# Patient Record
Sex: Female | Born: 1960
Health system: Southern US, Community
[De-identification: ages and names within clinical notes are randomized; demographics above are authoritative.]

## PROBLEM LIST (undated history)

## (undated) DIAGNOSIS — I10 Essential (primary) hypertension: Secondary | ICD-10-CM

## (undated) DIAGNOSIS — D329 Benign neoplasm of meninges, unspecified: Secondary | ICD-10-CM

## (undated) DIAGNOSIS — I341 Nonrheumatic mitral (valve) prolapse: Secondary | ICD-10-CM

## (undated) DIAGNOSIS — G43909 Migraine, unspecified, not intractable, without status migrainosus: Secondary | ICD-10-CM

## (undated) HISTORY — PX: OTHER SURGICAL HISTORY: SHX169

## (undated) HISTORY — DX: Essential (primary) hypertension: I10

## (undated) HISTORY — DX: Migraine, unspecified, not intractable, without status migrainosus: G43.909

## (undated) HISTORY — DX: Benign neoplasm of meninges, unspecified: D32.9

---

## 2004-01-09 ENCOUNTER — Other Ambulatory Visit: Admission: RE | Admit: 2004-01-09 | Discharge: 2004-01-09 | Payer: Self-pay | Admitting: Obstetrics and Gynecology

## 2004-01-23 ENCOUNTER — Ambulatory Visit (HOSPITAL_COMMUNITY): Admission: RE | Admit: 2004-01-23 | Discharge: 2004-01-23 | Payer: Self-pay | Admitting: Family Medicine

## 2004-02-05 ENCOUNTER — Encounter: Admission: RE | Admit: 2004-02-05 | Discharge: 2004-02-05 | Payer: Self-pay | Admitting: Family Medicine

## 2004-02-22 ENCOUNTER — Ambulatory Visit (HOSPITAL_COMMUNITY): Admission: RE | Admit: 2004-02-22 | Discharge: 2004-02-22 | Payer: Self-pay | Admitting: Obstetrics and Gynecology

## 2004-03-25 ENCOUNTER — Inpatient Hospital Stay (HOSPITAL_COMMUNITY): Admission: AD | Admit: 2004-03-25 | Discharge: 2004-03-25 | Payer: Self-pay | Admitting: *Deleted

## 2004-04-20 ENCOUNTER — Inpatient Hospital Stay (HOSPITAL_COMMUNITY): Admission: RE | Admit: 2004-04-20 | Discharge: 2004-04-20 | Payer: Self-pay | Admitting: Obstetrics and Gynecology

## 2004-05-16 ENCOUNTER — Inpatient Hospital Stay (HOSPITAL_COMMUNITY): Admission: AD | Admit: 2004-05-16 | Discharge: 2004-05-16 | Payer: Self-pay | Admitting: Obstetrics and Gynecology

## 2004-05-18 ENCOUNTER — Inpatient Hospital Stay (HOSPITAL_COMMUNITY): Admission: AD | Admit: 2004-05-18 | Discharge: 2004-05-18 | Payer: Self-pay | Admitting: Obstetrics and Gynecology

## 2005-06-13 ENCOUNTER — Emergency Department (HOSPITAL_COMMUNITY): Admission: EM | Admit: 2005-06-13 | Discharge: 2005-06-13 | Payer: Self-pay | Admitting: Emergency Medicine

## 2006-03-26 ENCOUNTER — Other Ambulatory Visit: Admission: RE | Admit: 2006-03-26 | Discharge: 2006-03-26 | Payer: Self-pay | Admitting: Family Medicine

## 2007-10-07 ENCOUNTER — Other Ambulatory Visit: Admission: RE | Admit: 2007-10-07 | Discharge: 2007-10-07 | Payer: Self-pay | Admitting: Family Medicine

## 2007-11-16 ENCOUNTER — Ambulatory Visit (HOSPITAL_COMMUNITY): Admission: RE | Admit: 2007-11-16 | Discharge: 2007-11-16 | Payer: Self-pay | Admitting: Family Medicine

## 2008-02-14 ENCOUNTER — Ambulatory Visit (HOSPITAL_BASED_OUTPATIENT_CLINIC_OR_DEPARTMENT_OTHER): Admission: RE | Admit: 2008-02-14 | Discharge: 2008-02-14 | Payer: Self-pay | Admitting: Orthopaedic Surgery

## 2010-09-21 ENCOUNTER — Encounter: Payer: Self-pay | Admitting: Family Medicine

## 2011-01-13 NOTE — Op Note (Signed)
NAME:  Joy Walsh, Joy Walsh              ACCOUNT NO.:  [id number]   MEDICAL RECORD NO.:  [id number]          PATIENT TYPE:  AMB   LOCATION:  DSC                          FACILITY:  [facility]   PHYSICIAN:  Lubertha Basque. Dalldorf, M.D.DATE OF BIRTH:  04-Oct-1960   DATE OF PROCEDURE:  02/14/2008  DATE OF DISCHARGE:                               OPERATIVE REPORT   PREOPERATIVE DIAGNOSIS:  Right shoulder impingement.   POSTOPERATIVE DIAGNOSES:  1. Right shoulder impingement.  2. Right shoulder rotator cuff tear.   PROCEDURE:  1. Right shoulder arthroscopic acromioplasty.  2. Right shoulder arthroscopic debridement.  3. Right shoulder arthroscopic rotator cuff repair.   ANESTHESIA:  General and block.   ATTENDING SURGEON:  Lubertha Basque. Jerl Santos, MD   ASSISTANT:  Lindwood Qua, PA   INDICATIONS FOR PROCEDURE:  The patient is a 50 year old woman with more  than a year of right shoulder pain.  This has persisted despite oral  anti-inflammatories and an exercise program.  She did receive a  subacromial injection which helped in a transient way.  By exam, she has  things consistent with impingement.  She has pain which limits her  ability to rest and use her arm and she is offered an arthroscopy.  Informed operative consent was obtained after discussion of possible  complications of reaction to anesthesia and infection.   SUMMARY/FINDINGS/PROCEDURE:  Under general anesthesia, an arthroscopy of  the right shoulder was performed.  The glenohumeral joint showed no  degenerative changes and the biceps tendon appeared normal.  The rotator  cuff did have an obvious tear in the supraspinatus portion seen from  below and above.  This was minimally retracted.  We performed an  acromioplasty back to a flat surface and then repaired her rotator cuff  in an arthroscopic fashion using a single reverse mattress of FiberTape  secured with a  PushLock anchor to a bleeding bed of bone.  Bryna Colander assisted  throughout by passing instruments and making this case  possible in an arthroscopic fashion.   DESCRIPTION OF PROCEDURE:  The patient was taken to the operating suite  where general anesthetic was applied without difficulty.  She was also  given a block in the preanesthesia area.  She was positioned in a beach-  chair position and prepped and draped in normal sterile fashion.  After  administration of IV Kefzol, an arthroscopy of the right shoulder was  performed through a total of 3 portals.  Findings were as noted above  and procedure consisted of the debridement of devitalized portion of her  rotator cuff tear and preparation of the repair of bed with a bur.  We  performed an acromioplasty with a bur in the lateral position following  a transfer to the posterior position.  I then used a Scorpion device to  pass a reverse mattress of FiberTape through her rotator cuff.  This was  then secured down to the bleeding bed of bone using the single  PushLock  by Arthrex.  This seemed to give [place] a nice tight repair.  Initially, we  thought we might place 2  PushLocks, but one seemed to be quite  sufficient and repaired her cuff nicely.  The shoulder was thoroughly  irrigated followed by placement of Marcaine with epinephrine and  morphine.  Simple sutures of nylon were used to loosely reapproximate  the portals followed by Adaptic and a dry gauze dressing with tape.  Estimated blood loss and intraoperative fluids obtained from the  anesthesia records.   DISPOSITION:  The patient was extubated in the operating room and taken  to recovery room in stable addition.  She wished to go home same-day and  followup in the office closely.  I will contact her by phone tonight.      Lubertha Basque Jerl Santos, M.D.  Electronically Signed     PGD/MEDQ  D:  02/14/2008  T:  02/14/2008  Job:  [id number]

## 2011-04-20 ENCOUNTER — Other Ambulatory Visit (HOSPITAL_COMMUNITY): Payer: Self-pay | Admitting: Family Medicine

## 2011-04-20 DIAGNOSIS — Z1231 Encounter for screening mammogram for malignant neoplasm of breast: Secondary | ICD-10-CM

## 2011-05-06 ENCOUNTER — Ambulatory Visit (HOSPITAL_COMMUNITY)
Admission: RE | Admit: 2011-05-06 | Discharge: 2011-05-06 | Disposition: A | Discharge status: Home / Self Care | Payer: BC Managed Care – PPO | Source: Ambulatory Visit | Attending: Family Medicine | Admitting: Family Medicine

## 2011-05-06 DIAGNOSIS — Z1231 Encounter for screening mammogram for malignant neoplasm of breast: Secondary | ICD-10-CM

## 2011-05-12 ENCOUNTER — Other Ambulatory Visit: Payer: Self-pay | Admitting: Family Medicine

## 2011-05-12 DIAGNOSIS — R928 Other abnormal and inconclusive findings on diagnostic imaging of breast: Secondary | ICD-10-CM

## 2011-05-18 ENCOUNTER — Ambulatory Visit
Admission: RE | Admit: 2011-05-18 | Discharge: 2011-05-18 | Disposition: A | Discharge status: Home / Self Care | Payer: BC Managed Care – PPO | Source: Ambulatory Visit | Attending: Family Medicine | Admitting: Family Medicine

## 2011-05-18 DIAGNOSIS — R928 Other abnormal and inconclusive findings on diagnostic imaging of breast: Secondary | ICD-10-CM

## 2011-05-28 LAB — BASIC METABOLIC PANEL
Calcium: 9.3
Chloride: 107
GFR calc Af Amer: >60
Potassium: 4.6
Sodium: 140

## 2011-09-07 ENCOUNTER — Ambulatory Visit
Admission: RE | Admit: 2011-09-07 | Discharge: 2011-09-07 | Disposition: A | Discharge status: Home / Self Care | Payer: BC Managed Care – PPO | Source: Ambulatory Visit | Attending: Family Medicine | Admitting: Family Medicine

## 2011-09-07 ENCOUNTER — Other Ambulatory Visit: Payer: Self-pay | Admitting: Family Medicine

## 2011-09-07 DIAGNOSIS — R519 Headache, unspecified: Secondary | ICD-10-CM

## 2011-09-07 MED ORDER — GADOBENATE DIMEGLUMINE 529 MG/ML IV SOLN
17.0000 mL | Freq: Once | INTRAVENOUS | Status: AC | PRN
  Administered 2011-09-07: 17 mL via INTRAVENOUS

## 2011-12-15 ENCOUNTER — Other Ambulatory Visit: Payer: Self-pay | Admitting: Family Medicine

## 2011-12-15 ENCOUNTER — Other Ambulatory Visit (HOSPITAL_COMMUNITY)
Admission: RE | Admit: 2011-12-15 | Discharge: 2011-12-15 | Disposition: A | Discharge status: Home / Self Care | Payer: BC Managed Care – PPO | Source: Ambulatory Visit | Attending: Family Medicine | Admitting: Family Medicine

## 2011-12-15 DIAGNOSIS — Z124 Encounter for screening for malignant neoplasm of cervix: Secondary | ICD-10-CM | POA: Insufficient documentation

## 2013-05-19 ENCOUNTER — Telehealth: Payer: Self-pay | Admitting: Neurology

## 2013-05-22 NOTE — Telephone Encounter (Signed)
Chart reviewed, last office visit was in Jan 2013, for migraine, please give her a follow up appt on next available, mid level or with me.

## 2013-05-22 NOTE — Telephone Encounter (Signed)
Patient says she has been having HA(s) off and on that seem to be increasing since she had a strange "pop in head". The "pop in head" happened four days ago. Her tongue became swollen and she says she had severe R side head pain including ear and jaw pain. She says she experienced balance issues also. Patient said she did not contact her PCP about this, but took Ibuprofen which seem to alleviate pain. Now she is concerned something is wrong and would like to schedule appt asap.

## 2013-05-23 NOTE — Telephone Encounter (Signed)
I called pt and her sx she had have resolved.  Cause?  Made RV for her, last seen 09/2011. 05-26-13 at 0930 with LL/NP.

## 2013-05-26 ENCOUNTER — Ambulatory Visit: Payer: Self-pay | Admitting: Nurse Practitioner

## 2013-05-29 ENCOUNTER — Ambulatory Visit: Payer: Self-pay | Admitting: Nurse Practitioner

## 2013-05-29 ENCOUNTER — Encounter: Payer: Self-pay | Admitting: Nurse Practitioner

## 2013-06-16 ENCOUNTER — Telehealth: Payer: Self-pay | Admitting: Neurology

## 2013-06-16 NOTE — Telephone Encounter (Signed)
Spoke to patient. She says she missed her last f/u appt because her son was sick. She has questions about the need to order a MRI. Scheduled appt w/ NP-Lynn on Friday, 06/23/13.

## 2013-06-19 ENCOUNTER — Encounter: Payer: Self-pay | Admitting: Nurse Practitioner

## 2013-06-19 ENCOUNTER — Ambulatory Visit (INDEPENDENT_AMBULATORY_CARE_PROVIDER_SITE_OTHER): Payer: BC Managed Care – PPO | Admitting: Nurse Practitioner

## 2013-06-19 VITALS — BP 130/77 | HR 58 | Temp 98.2°F | Ht 64.0 in | Wt 181.0 lb

## 2013-06-19 DIAGNOSIS — G43009 Migraine without aura, not intractable, without status migrainosus: Secondary | ICD-10-CM

## 2013-06-19 DIAGNOSIS — D320 Benign neoplasm of cerebral meninges: Secondary | ICD-10-CM

## 2013-06-19 NOTE — Patient Instructions (Signed)
We are ordering a follow up MRI for meningioma and head pain symptoms.  Follow up in 4 weeks.

## 2013-06-19 NOTE — Progress Notes (Signed)
[facility]  PATIENT: Joy Walsh DOB: [birth year]   REASON FOR VISIT: follow up HISTORY FROM: patient  HISTORY OF PRESENT ILLNESS: Joy Walsh is a 52 year old right-handed Caucasian female, she is referred by her primary care physician Dr. Clyde Canterbury for evaluation of frequent headaches, MRI findings of left frontal parasagittal meningioma She has a history of migraine headaches, there was typical migraine headaches preceding with visual aura, but is rare  happened, in August 23, 2011, she had a quick building up of severe pounding headaches, bilateral retro-orbital, with associated nausea, improved after overnight sleep, but she had mild degree of nausea ever since, she denies lateralized motor or sensory deficit, she had light streak in the right temporal peripheral visual field, was evaluated by an ophthalmologist recently, was told to have right vitreous dettachment. Ever since the severe migraine in December 23, she has frequent nausea, mild bilateral frontal pressure headaches, she had MRI of the brain without contrast in September 07 2011, which demonstrated 11 mm left frontal parasagittal meningioma, contrast enhancement, broad-based close to the beginning of the superior sagittal sinus  MRV was normal.  She was given a prescription of Zofran, she has not tried it yet, Excedrin Migraine as needed basis.  UPDATE 06/19/13:  Patient comes to office requesting revisit after recent (5 weeks ago) experiencing an audible "pop" in her head, then having right ear pain and pain in the right side of her tongue with the feeling that it was thick or swollen for 4-5 days.  She felt like her speech was slurred during that time as well.  Her family members noticed some difficulty with her speech. She did not seek medical attention.  It resolved fully within 5 days.  She still has headaches as before, but denies nausea or light sensitivity with them, they do not keep her from her usual  activities.  She has some difficulty with blurred vision, especially up close, but has had her vision checked and it was WNL, not needing corrective lenses to drive. She mentions her mother and now her brother have been dx with Lupus.    REVIEW OF SYSTEMS: Full 14 system review of systems performed and notable only for:  Constitutional: fatigue  Neurological: memory loss, headache, numbness, weakness, slurred speech, dizziness   ALLERGIES: Allergies  Allergen Reactions  . Sulfa Antibiotics     HOME MEDICATIONS: Outpatient Prescriptions Prior to Visit  Medication Sig Dispense Refill  . Multiple Vitamin (MULTIVITAMIN) capsule Take 1 capsule by mouth daily.      Marland Kitchen omeprazole (PRILOSEC) 10 MG capsule Take 10 mg by mouth daily.      Marland Kitchen BISOPROLOL FUMARATE PO Take by mouth daily.       No facility-administered medications prior to visit.   Marland Kitchen bisoprolol-hydrochlorothiazide (ZIAC) 10-6.25 MG per tablet    Sig: Take 1 tablet by mouth daily.    PAST MEDICAL HISTORY: Past Medical History  Diagnosis Date  . Meningioma   . Migraine   . Hypertension     PAST SURGICAL HISTORY: Past Surgical History  Procedure Laterality Date  . None      FAMILY HISTORY: History reviewed. No pertinent family history.  SOCIAL HISTORY: History   Social History  . Marital Status: Single    Spouse Name: N/A    Number of Children: 5  . Years of Education: college   Occupational History  . Not on file.   Social History Main Topics  . Smoking status: Never Smoker   . Smokeless tobacco:  Never Used  . Alcohol Use: Yes     Comment: 1-2 weekly  . Drug Use: No  . Sexual Activity: Not on file   Other Topics Concern  . Not on file   Social History Narrative   Pt lives at home with her roommate. Pt has her masters's degree. Pt has five children. Pt denies any history of tobacco and illicit drugs. Pt drinks alcohol once a week.   Caffeine Use: 3-4 times weekly     PHYSICAL EXAM  Filed Vitals:    06/19/13 0854  BP: 130/77  Pulse: 58  Temp: 98.2 F (36.8 C)  TempSrc: Oral  Height: 5\' 4"  (1.626 m)  Weight: 181 lb (82.101 kg)   Body mass index is 31.05 kg/(m^2).  Physical Exam  Neck: supple no carotid bruits Respiratory: clear to auscultation bilaterally Cardiovascular: regular rate rhythm  Neurologic Exam  Mental Status: pleasant, awake, alert, cooperative to history, talking, and casual conversation. Cranial Nerves: CN II-XII pupils were equal round reactive to light.  Fundi were sharp bilaterally.  Extraocular movements were full.  Visual fields were full on confrontational test.  Facial sensation and strength were normal.  Hearing was intact to finger rubbing bilaterally.  Uvula tongue were midline.  Head turning and shoulder shrugging were normal and symmetric.  Tongue protrusion into the cheeks strength were normal.  Motor: Normal tone, bulk, and strength. Sensory: Normal to light touch Coordination: Normal finger-to-nose, heel-to-shin.  There was no dysmetria noticed. Gait and Station: Narrow based and steady, was able to perform tiptoe, heel, and tandem walking without difficulty.  Romberg sign: Negative Reflexes: normal and equal  DIAGNOSTIC DATA (LABS, IMAGING, TESTING) - I reviewed patient records, labs, notes, testing and imaging myself where available.  Lab Results  Component Value Date   HGB 15.1 POINT OF CARE RESULT* 02/14/2008      Component Value Date/Time   NA 140 02/13/2008 1135   K 4.6 02/13/2008 1135   CL 107 02/13/2008 1135   CO2 29 02/13/2008 1135   GLUCOSE 92 02/13/2008 1135   BUN 12 02/13/2008 1135   CREATININE 0.81 02/13/2008 1135   CALCIUM 9.3 02/13/2008 1135   GFRNONAA >60 02/13/2008 1135   GFRAA  Value: >60        The eGFR has been calculated using the MDRD equation. This calculation has not been validated in all clinical 02/13/2008 1135   09/07/11 MRI BRAIN W/WO Normal appearance of the brain itself.  Likely incidental 11 mm meningioma in the  medial left frontal convexity region adjacent to the beginning of the sagittal sinus with a broad base along the falx.  09/07/11 MRV of the Head Patent large and deep cerebral venous system without evidence of thrombosis.  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  ASSESSMENT AND PLAN 52 yo right-handed Caucasian female, with past medical history of migraine headaches, presenting with one recurrent severe migraine headaches incidental finding of left frontal parasagittal angioma close to sagittal sinus found Jan. 2013.  Since then, continuous mild headaches with one recent episode of an audible "pop" in head, then right ear pain and right tongue pain with perception of swollen tongue, which lasted 3-5 days.    1. Headaches, mixed migraine and tension/meningioma? 2. Follow up MRI brain w/Wo contrast to follow up Meningioma. 3. Continue Excedrin Migraine p.r.n. 4  return to clinic in 4 weeks.  Orders Placed This Encounter  Procedures  . MR Brain W Wo Contrast   Crystalynn Mcinerney  Dominic Pea, MSN, NP-C 06/19/2013, 9:43 AM [facility] [address], [address] [place], [place] [address] [phone]

## 2013-06-23 ENCOUNTER — Ambulatory Visit: Payer: Self-pay | Admitting: Nurse Practitioner

## 2013-06-29 ENCOUNTER — Ambulatory Visit (INDEPENDENT_AMBULATORY_CARE_PROVIDER_SITE_OTHER): Payer: BC Managed Care – PPO

## 2013-06-29 DIAGNOSIS — G43009 Migraine without aura, not intractable, without status migrainosus: Secondary | ICD-10-CM

## 2013-06-29 DIAGNOSIS — D320 Benign neoplasm of cerebral meninges: Secondary | ICD-10-CM

## 2013-06-29 MED ORDER — GADOPENTETATE DIMEGLUMINE 469.01 MG/ML IV SOLN: 17.0000 mL | Freq: Once | INTRAVENOUS | Status: AC | PRN

## 2013-06-30 ENCOUNTER — Telehealth: Payer: Self-pay | Admitting: *Deleted

## 2013-06-30 NOTE — Telephone Encounter (Signed)
Called patient and inform that the MRI  Results has to  Be read and reviewed by doctor, may be ready on Monday,patient understood

## 2013-07-03 NOTE — Progress Notes (Signed)
Quick Note:  Please call patient, MRI brain showed 1.6 x 1.2 x 1.2 cm left medial frontal convexity meningioma without surrounding mass effect, edema or midline shift. It appears to be slightly increased in size compared with previous study dated 09/07/2011.  ______

## 2013-07-04 ENCOUNTER — Telehealth: Payer: Self-pay | Admitting: Nurse Practitioner

## 2013-07-04 NOTE — Telephone Encounter (Signed)
Called patient, MRI brain showed 1.6 x 1.2 x 1.2 cm left medial frontal convexity meningioma without surrounding mass effect, edema or midline shift. It appears to be slightly increased in size compared with previous study dated 09/07/2011.  She acknowledged results, and asked that results be sent to Dr. Alphonsus Sias at [facility], she had signed a release at last visit.

## 2013-07-07 NOTE — Telephone Encounter (Signed)
Please take care of this for me. Not sure why sent to forms.

## 2013-07-20 ENCOUNTER — Ambulatory Visit: Payer: Self-pay | Admitting: Nurse Practitioner

## 2013-07-21 ENCOUNTER — Ambulatory Visit: Payer: BC Managed Care – PPO | Admitting: Nurse Practitioner

## 2013-07-25 ENCOUNTER — Encounter: Payer: Self-pay | Admitting: Nurse Practitioner

## 2013-07-25 ENCOUNTER — Ambulatory Visit (INDEPENDENT_AMBULATORY_CARE_PROVIDER_SITE_OTHER): Payer: BC Managed Care – PPO | Admitting: Nurse Practitioner

## 2013-07-25 VITALS — BP 126/75 | HR 62 | Ht 64.0 in | Wt 183.0 lb

## 2013-07-25 DIAGNOSIS — D320 Benign neoplasm of cerebral meninges: Secondary | ICD-10-CM

## 2013-07-25 DIAGNOSIS — G43009 Migraine without aura, not intractable, without status migrainosus: Secondary | ICD-10-CM

## 2013-07-25 NOTE — Progress Notes (Signed)
[facility]  PATIENT: Joy Walsh DOB: 10-28-1960   REASON FOR VISIT: follow up HISTORY FROM: patient  HISTORY OF PRESENT ILLNESS: Joy Walsh is a 52 year old right-handed Caucasian female, she is referred by her primary care physician Dr. Clyde Canterbury for evaluation of frequent headaches, MRI findings of left frontal parasagittal meningioma  She has a history of migraine headaches, there was typical migraine headaches preceding with visual aura, but is rare happened, in August 23, 2011, she had a quick building up of severe pounding headaches, bilateral retro-orbital, with associated nausea, improved after overnight sleep, but she had mild degree of nausea ever since, she denies lateralized motor or sensory deficit, she had light streak in the right temporal peripheral visual field, was evaluated by an ophthalmologist recently, was told to have right vitreous dettachment.  Ever since the severe migraine in December 23, she has frequent nausea, mild bilateral frontal pressure headaches, she had MRI of the brain without contrast in September 07 2011, which demonstrated 11 mm left frontal parasagittal meningioma, contrast enhancement, broad-based close to the beginning of the superior sagittal sinus MRV was normal. She was given a prescription of Zofran, she has not tried it yet, Excedrin Migraine as needed basis.   UPDATE 06/19/13: Patient comes to office requesting revisit after recent (5 weeks ago) experiencing an audible "pop" in her head, then having right ear pain and pain in the right side of her tongue with the feeling that it was thick or swollen for 4-5 days. She felt like her speech was slurred during that time as well. Her family members noticed some difficulty with her speech. She did not seek medical attention. It resolved fully within 5 days. She still has headaches as before, but denies nausea or light sensitivity with them, they do not keep her from her usual  activities. She has some difficulty with blurred vision, especially up close, but has had her vision checked and it was WNL, not needing corrective lenses to drive. She mentions her mother and now her brother have been dx with Lupus.   UPDATE 07/25/13 (LL):  Patient comes in today for review of MRI results: 1.6 x 1.2 x 1.2 cm left medial frontal convexity meningioma without surrounding mass effect, edema or midline shift. It appears to be slightly increased in size compared with previous study dated 09/07/2011.  Dr. Terrace Arabia recommends follow up yearly.  She is requesting imaging be sent to Dr. Alphonsus Sias at [facility], she had consulted him regarding possible treatment with gamma knife.  She had had no recurrent symptoms of numbness or swelling in her tongue.  Headaches are occasional; no worse.  She has had recent URI, with sinus congestion and earache, says the whole family had it, but feeling better now.  REVIEW OF SYSTEMS: Full 14 system review of systems performed and notable only for: (nothing).  ALLERGIES: Allergies  Allergen Reactions  . Sulfa Antibiotics     HOME MEDICATIONS: Outpatient Prescriptions Prior to Visit  Medication Sig Dispense Refill  . aspirin-acetaminophen-caffeine (EXCEDRIN MIGRAINE) 250-250-65 MG per tablet Take 1 tablet by mouth every 6 (six) hours as needed for pain.      . bisoprolol-hydrochlorothiazide (ZIAC) 10-6.25 MG per tablet Take 1 tablet by mouth daily.      Marland Kitchen ibuprofen (ADVIL,MOTRIN) 200 MG tablet Take 200 mg by mouth every 6 (six) hours as needed for pain.      . Multiple Vitamin (MULTIVITAMIN) capsule Take 1 capsule by mouth daily.      Marland Kitchen  omeprazole (PRILOSEC) 10 MG capsule Take 10 mg by mouth daily.       No facility-administered medications prior to visit.    PAST MEDICAL HISTORY: Past Medical History  Diagnosis Date  . Meningioma   . Migraine   . Hypertension     PAST SURGICAL HISTORY: Past Surgical History  Procedure Laterality Date  .  None      FAMILY HISTORY: No family history on file.  SOCIAL HISTORY: History   Social History  . Marital Status: Single    Spouse Name: N/A    Number of Children: 5  . Years of Education: college   Occupational History  . Not on file.   Social History Main Topics  . Smoking status: Never Smoker   . Smokeless tobacco: Never Used  . Alcohol Use: Yes     Comment: 1-2 weekly  . Drug Use: No  . Sexual Activity: No   Other Topics Concern  . Not on file   Social History Narrative   Pt lives at home with her roommate. Pt has her masters's degree. Pt has five children. Pt denies any history of tobacco and illicit drugs. Pt drinks alcohol once a week.   Caffeine Use: 3-4 times weekly   PHYSICAL EXAM  Filed Vitals:   07/25/13 0909  BP: 126/75  Pulse: 62  Height: 5\' 4"  (1.626 m)  Weight: 183 lb (83.008 kg)   Body mass index is 31.4 kg/(m^2).  Physical Exam  Neck: supple no carotid bruits  Respiratory: clear to auscultation bilaterally  Cardiovascular: regular rate rhythm   Neurologic Exam  Mental Status: pleasant, awake, alert, cooperative to history, talking, and casual conversation.  Cranial Nerves: CN II-XII pupils were equal round reactive to light.  Extraocular movements were full. Facial sensation and strength were normal. Hearing was normal. Uvula tongue were midline. Head turning and shoulder shrugging were normal and symmetric. Motor: Normal tone, bulk, and strength.  Sensory: Normal to light touch  Coordination: Normal finger-to-nose. Gait and Station: Narrow based and steady, was able to perform tiptoe, heel, and tandem walking without difficulty.  Reflexes: normal and equal  DIAGNOSTIC DATA (LABS, IMAGING, TESTING) - I reviewed patient records, labs, notes, testing and imaging myself where available.  09/07/11 MRI BRAIN W/WO  Normal appearance of the brain itself. Likely incidental 11 mm meningioma in the medial left frontal convexity region adjacent to  the beginning of the sagittal sinus with a broad base along the falx.  09/07/11 MRV of the Head  Patent large and deep cerebral venous system without evidence of thrombosis. 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  07/03/13 MRI BRAIN W/WO  Abnormal MRI scan of the brain showing 1.6 x 1.2 x 1.2 cm left medial frontal convexity meningioma without surrounding mass effect, edema or midline shift. It appears to be slightly increased in size compared with previous study dated 09/07/2011.  ASSESSMENT AND PLAN 52 yo right-handed Caucasian female, with past medical history of migraine headaches, presenting with one recurrent severe migraine headaches incidental finding of left frontal parasagittal angioma close to sagittal sinus found Jan. 2013. Since then, continuous mild headaches with one recent episode of an audible "pop" in head, then right ear pain and right tongue pain with perception of swollen tongue, which lasted 3-5 days. Repeat MRI shows meningioma has grown slightly.  1. Headaches, mixed migraine and tension 2. Follow up MRI brain w/Wo contrast to follow up Meningioma in 1 year.  3. Continue Excedrin  Migraine p.r.n.  4  Return to clinic in 1 year, sooner as needed.  Orders Placed This Encounter  Procedures  . MR Brain W Wo Contrast, future, around 07/01/14.   Ronal Fear, MSN, NP-C 07/25/2013, 9:31 AM [facility] [address] [place], [place] [address] [phone]  Note: This document was prepared with digital dictation and possible smart phrase technology. Any transcriptional errors that result from this process are unintentional.

## 2013-11-02 ENCOUNTER — Ambulatory Visit
Admission: RE | Admit: 2013-11-02 | Discharge: 2013-11-02 | Disposition: A | Discharge status: Home / Self Care | Payer: BC Managed Care – PPO | Source: Ambulatory Visit

## 2013-11-02 ENCOUNTER — Other Ambulatory Visit: Payer: Self-pay

## 2013-11-02 DIAGNOSIS — Z1231 Encounter for screening mammogram for malignant neoplasm of breast: Secondary | ICD-10-CM

## 2013-11-06 ENCOUNTER — Other Ambulatory Visit: Payer: Self-pay | Admitting: Family Medicine

## 2013-11-06 DIAGNOSIS — R928 Other abnormal and inconclusive findings on diagnostic imaging of breast: Secondary | ICD-10-CM

## 2013-11-16 ENCOUNTER — Ambulatory Visit: Payer: BC Managed Care – PPO

## 2013-11-22 ENCOUNTER — Ambulatory Visit
Admission: RE | Admit: 2013-11-22 | Discharge: 2013-11-22 | Disposition: A | Discharge status: Home / Self Care | Payer: BC Managed Care – PPO | Source: Ambulatory Visit | Attending: Family Medicine | Admitting: Family Medicine

## 2013-11-22 DIAGNOSIS — R928 Other abnormal and inconclusive findings on diagnostic imaging of breast: Secondary | ICD-10-CM

## 2014-06-14 ENCOUNTER — Other Ambulatory Visit: Payer: Self-pay | Admitting: Family Medicine

## 2014-06-14 DIAGNOSIS — N63 Unspecified lump in unspecified breast: Secondary | ICD-10-CM

## 2014-06-20 ENCOUNTER — Other Ambulatory Visit: Payer: BC Managed Care – PPO

## 2014-06-21 ENCOUNTER — Ambulatory Visit
Admission: RE | Admit: 2014-06-21 | Discharge: 2014-06-21 | Disposition: A | Discharge status: Home / Self Care | Payer: BC Managed Care – PPO | Source: Ambulatory Visit | Attending: Family Medicine | Admitting: Family Medicine

## 2014-06-21 DIAGNOSIS — N63 Unspecified lump in unspecified breast: Secondary | ICD-10-CM

## 2014-07-04 ENCOUNTER — Ambulatory Visit (INDEPENDENT_AMBULATORY_CARE_PROVIDER_SITE_OTHER): Payer: BC Managed Care – PPO

## 2014-07-04 DIAGNOSIS — D320 Benign neoplasm of cerebral meninges: Secondary | ICD-10-CM

## 2014-07-09 MED ORDER — GADOPENTETATE DIMEGLUMINE 469.01 MG/ML IV SOLN: 18.0000 mL | Freq: Once | INTRAVENOUS | Status: AC | PRN

## 2014-07-12 ENCOUNTER — Telehealth: Payer: Self-pay

## 2014-07-12 NOTE — Telephone Encounter (Signed)
-----   Message from [name], NP sent at 07/09/2014  5:06 PM EST ----- Please call patient, MRI shows meningioma which appears unchanged from previous MRI dated 09/07/2011.

## 2014-07-12 NOTE — Telephone Encounter (Signed)
Called patient.  No answer.

## 2014-07-13 NOTE — Telephone Encounter (Signed)
Called patient, left message MRI is unchanged from previous scan, call back with any questions or concerns.

## 2014-07-25 ENCOUNTER — Ambulatory Visit: Payer: BC Managed Care – PPO | Admitting: Neurology

## 2014-09-11 ENCOUNTER — Encounter: Payer: Self-pay | Admitting: Neurology

## 2014-09-11 ENCOUNTER — Ambulatory Visit (INDEPENDENT_AMBULATORY_CARE_PROVIDER_SITE_OTHER): Payer: BLUE CROSS/BLUE SHIELD | Admitting: Neurology

## 2014-09-11 VITALS — BP 124/78 | HR 65 | Ht 64.0 in | Wt 180.0 lb

## 2014-09-11 DIAGNOSIS — G43709 Chronic migraine without aura, not intractable, without status migrainosus: Secondary | ICD-10-CM

## 2014-09-11 DIAGNOSIS — G43909 Migraine, unspecified, not intractable, without status migrainosus: Secondary | ICD-10-CM | POA: Insufficient documentation

## 2014-09-11 NOTE — Progress Notes (Signed)
[facility]  PATIENT: Joy Walsh DOB: 1960/10/31   REASON FOR VISIT: follow up HISTORY FROM: patient  HISTORY OF PRESENT ILLNESS: Joy Walsh is a 54 year old right-handed Caucasian female, she is referred by her primary care physician Dr. Hosie Walsh for evaluation of frequent headaches, MRI findings of left frontal parasagittal meningioma   She has a history of migraine headaches, there was typical migraine headaches preceding with visual aura, but is rare happened, in August 23, 2011, she had a quick building up of severe pounding headaches, bilateral retro-orbital, with associated nausea, improved after overnight sleep, but she had mild degree of nausea ever since, she denies lateralized motor or sensory deficit, she had light streak in the right temporal peripheral visual field, was evaluated by an ophthalmologist recently, was told to have right vitreous dettachment.   Ever since the severe migraine in December 2012, she has frequent nausea, mild bilateral frontal pressure headaches, she had MRI of the brain without contrast in September 07 2011, which demonstrated 11 mm left frontal parasagittal meningioma, contrast enhancement, broad-based close to the beginning of the superior sagittal sinus. MRV was normal. She was given a prescription of Zofran, she has not tried it yet, Excedrin Migraine as needed basis.   UPDATE 06/19/13: Patient comes to office requesting revisit after recent (5 weeks ago) experiencing an audible "pop" in her head, then having right ear pain and pain in the right side of her tongue with the feeling that it was thick or swollen for 4-5 days. She felt like her speech was slurred during that time as well. Her family members noticed some difficulty with her speech. She did not seek medical attention. It resolved fully within 5 days. She still has headaches as before, but denies nausea or light sensitivity with them, they do not keep her from her usual  activities.   Repeat MRI 05/2013: 1.6 x 1.2 x 1.2 cm left medial frontal convexity meningioma without surrounding mass effect, edema or midline shift. It appears to be slightly increased in size compared with previous study dated 09/07/2011.  Marland Kitchen  She is requesting imaging be sent to Joy Walsh at [facility], she had consulted him regarding possible treatment with gamma knife.  She had had no recurrent symptoms of numbness or swelling in her tongue.  Headaches are occasional; no worse.    UPDATE Jan 12th 2016: She continue to has frequent right-sided headaches, retro-orbital area moderate pounding headaches, with associated light noise sensitivity, occasionally nauseous, lasting for a few hours, to whole day, she takes Excedrin Migraine as needed, which has been helpful, She was seen by [facility] neurosurgeon Dr. Domingo Walsh, deemed not to be a surgical candidate  She does not want take any prescription medications for her headaches  REVIEW OF SYSTEMS: Full 14 system review of systems performed and notable only [id number] headaches   ALLERGIES: Allergies  Allergen Reactions  . Sulfa Antibiotics Rash    HOME MEDICATIONS: Outpatient Prescriptions Prior to Visit  Medication Sig Dispense Refill  . aspirin-acetaminophen-caffeine (EXCEDRIN MIGRAINE) 250-250-65 MG per tablet Take 1 tablet by mouth every 6 (six) hours as needed for pain.    . bisoprolol-hydrochlorothiazide (ZIAC) 10-6.25 MG per tablet Take 1 tablet by mouth daily.    Marland Kitchen ibuprofen (ADVIL,MOTRIN) 200 MG tablet Take 200 mg by mouth every 6 (six) hours as needed for pain.    . Multiple Vitamin (MULTIVITAMIN) capsule Take 1 capsule by mouth daily.    Marland Kitchen omeprazole (PRILOSEC) 10 MG capsule Take 10 mg  by mouth daily.    Marland Kitchen azithromycin (ZITHROMAX) 250 MG tablet      No facility-administered medications prior to visit.    PAST MEDICAL HISTORY: Past Medical History  Diagnosis Date  . Meningioma   . Migraine   . Hypertension     PAST  SURGICAL HISTORY: Past Surgical History  Procedure Laterality Date  . None      FAMILY HISTORY: Family History  Problem Relation Age of Onset  . Heart Problems Father     SOCIAL HISTORY: History   Social History  . Marital Status: Single    Spouse Name: N/A    Number of Children: 5  . Years of Education: college   Occupational History  .      Self Employed   Social History Main Topics  . Smoking status: Never Smoker   . Smokeless tobacco: Never Used  . Alcohol Use: Yes     Comment: 1-2 weekly  . Drug Use: No  . Sexual Activity: No   Other Topics Concern  . Not on file   Social History Narrative   Pt lives at home with her roommate. Pt has her masters's degree. Pt has five children. Pt denies any history of tobacco and illicit drugs. Pt drinks alcohol once a week.   Caffeine Use: 3-4 times weekly   PHYSICAL EXAM  Filed Vitals:   09/11/14 1041  BP: 124/78  Pulse: 65  Height: 5\' 4"  (1.626 m)  Weight: 180 lb (81.647 kg)   Body mass index is 30.88 kg/(m^2).  Physical Exam  Neck: supple no carotid bruits  Respiratory: clear to auscultation bilaterally  Cardiovascular: regular rate rhythm   Neurologic Exam  Mental Status: pleasant, awake, alert, cooperative to history, talking, and casual conversation.  Cranial Nerves: CN II-XII pupils were equal round reactive to light.  Extraocular movements were full. Facial sensation and strength were normal. Hearing was normal. Uvula tongue were midline. Head turning and shoulder shrugging were normal and symmetric. Motor: Normal tone, bulk, and strength.  Sensory: Normal to light touch  Coordination: Normal finger-to-nose. Gait and Station: Narrow based and steady, was able to perform tiptoe, heel, and tandem walking without difficulty.  Reflexes: normal and equal  DIAGNOSTIC DATA (LABS, IMAGING, TESTING) - I reviewed patient records, labs, notes, testing and imaging myself where available.  09/07/11 MRI BRAIN W/WO    Normal appearance of the brain itself. Likely incidental 11 mm meningioma in the medial left frontal convexity region adjacent to the beginning of the sagittal sinus with a broad base along the falx.  09/07/11 MRV of the Head  Patent large and deep cerebral venous system without evidence of thrombosis. 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  07/03/13 MRI BRAIN W/WO  Abnormal MRI scan of the brain showing 1.6 x 1.2 x 1.2 cm left medial frontal convexity meningioma without surrounding mass effect, edema or midline shift. It appears to be slightly increased in size compared with previous study dated 09/07/2011.  ASSESSMENT AND PLAN 54 yo right-handed Caucasian female, with past medical history of migraine headaches, incidental findings of left parasagittal meningeal month, repeat yearly scan has been stable, most recent one was November 2015, she was also seen by [facility] neurosurgeon Dr. Domingo Walsh, not surgical candidate   1. Headaches, with  mixed migraine and tension features 2. Follow up MRI brain w/Wo contrast to follow up Meningioma  by Dr. Domingo Walsh 3. return to clinic as needed   Marcial Pacas, M.D. Ph.D.  [facility] [place], [place] [address] Phone: [phone] Fax:      [phone]

## 2014-10-08 ENCOUNTER — Ambulatory Visit
Admission: RE | Admit: 2014-10-08 | Discharge: 2014-10-08 | Disposition: A | Discharge status: Home / Self Care | Payer: BLUE CROSS/BLUE SHIELD | Source: Ambulatory Visit | Attending: Family Medicine | Admitting: Family Medicine

## 2014-10-08 ENCOUNTER — Other Ambulatory Visit: Payer: Self-pay | Admitting: Family Medicine

## 2014-10-08 DIAGNOSIS — M79605 Pain in left leg: Secondary | ICD-10-CM

## 2014-10-17 DIAGNOSIS — Z0289 Encounter for other administrative examinations: Secondary | ICD-10-CM

## 2014-10-30 ENCOUNTER — Other Ambulatory Visit (HOSPITAL_COMMUNITY)
Admission: RE | Admit: 2014-10-30 | Discharge: 2014-10-30 | Disposition: A | Discharge status: Home / Self Care | Payer: BLUE CROSS/BLUE SHIELD | Source: Ambulatory Visit | Attending: Family Medicine | Admitting: Family Medicine

## 2014-10-30 ENCOUNTER — Other Ambulatory Visit: Payer: Self-pay | Admitting: Family Medicine

## 2014-10-30 DIAGNOSIS — Z01419 Encounter for gynecological examination (general) (routine) without abnormal findings: Secondary | ICD-10-CM | POA: Diagnosis present

## 2014-10-30 DIAGNOSIS — Z1151 Encounter for screening for human papillomavirus (HPV): Secondary | ICD-10-CM | POA: Insufficient documentation

## 2014-11-01 LAB — CYTOLOGY - PAP

## 2014-12-04 ENCOUNTER — Other Ambulatory Visit: Payer: Self-pay

## 2014-12-04 DIAGNOSIS — N6320 Unspecified lump in the left breast, unspecified quadrant: Secondary | ICD-10-CM

## 2015-01-07 ENCOUNTER — Other Ambulatory Visit: Payer: Self-pay | Admitting: Family Medicine

## 2015-01-07 DIAGNOSIS — N6320 Unspecified lump in the left breast, unspecified quadrant: Secondary | ICD-10-CM

## 2015-01-08 ENCOUNTER — Ambulatory Visit
Admission: RE | Admit: 2015-01-08 | Discharge: 2015-01-08 | Disposition: A | Discharge status: Home / Self Care | Payer: BLUE CROSS/BLUE SHIELD | Source: Ambulatory Visit

## 2015-01-08 DIAGNOSIS — N6320 Unspecified lump in the left breast, unspecified quadrant: Secondary | ICD-10-CM

## 2015-03-10 IMAGING — CR DG TIBIA/FIBULA 2V*L*
4 series · 4 of 4 positions shown · non-contrast
Comparison: Knee series of October 01, 2014.

CLINICAL DATA: Status post fall 1 week ago on great pain month
striking the left tibia and fibula, continued pain, subsequent and
count are

EXAM:
LEFT TIBIA AND FIBULA - 2 VIEW

[t tib/fib ap left (1 of 2)]
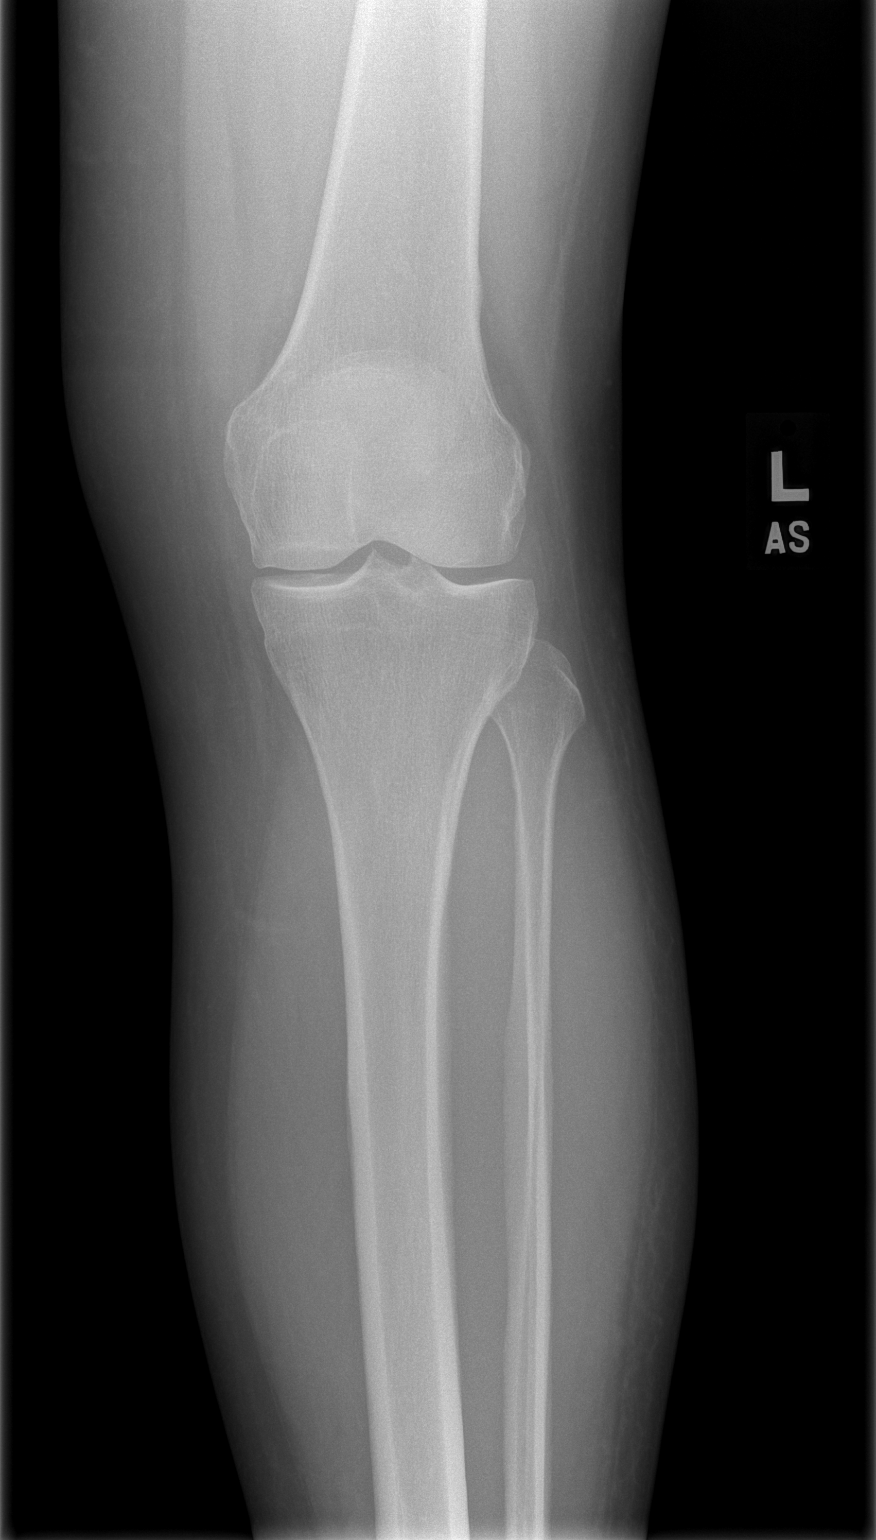

[t tib/fib ap left (2 of 2)]
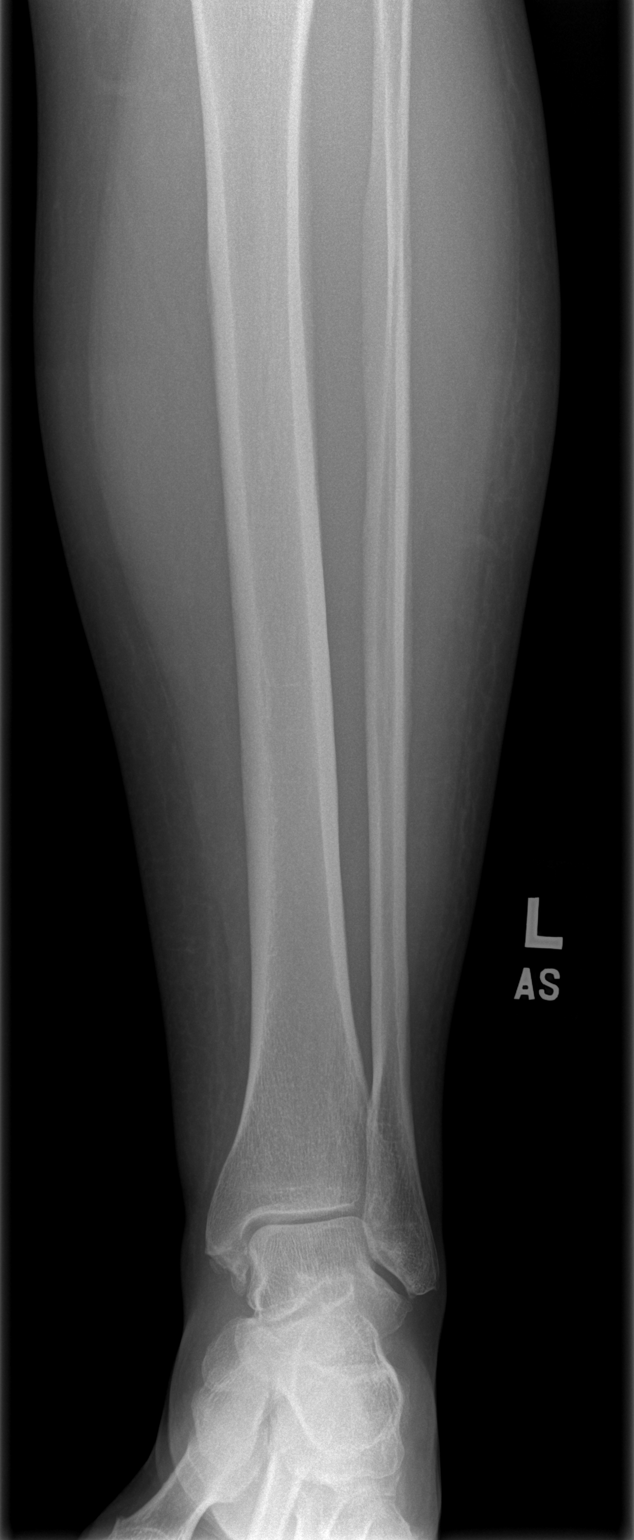

[t tib/fib lat left (1 of 2)]
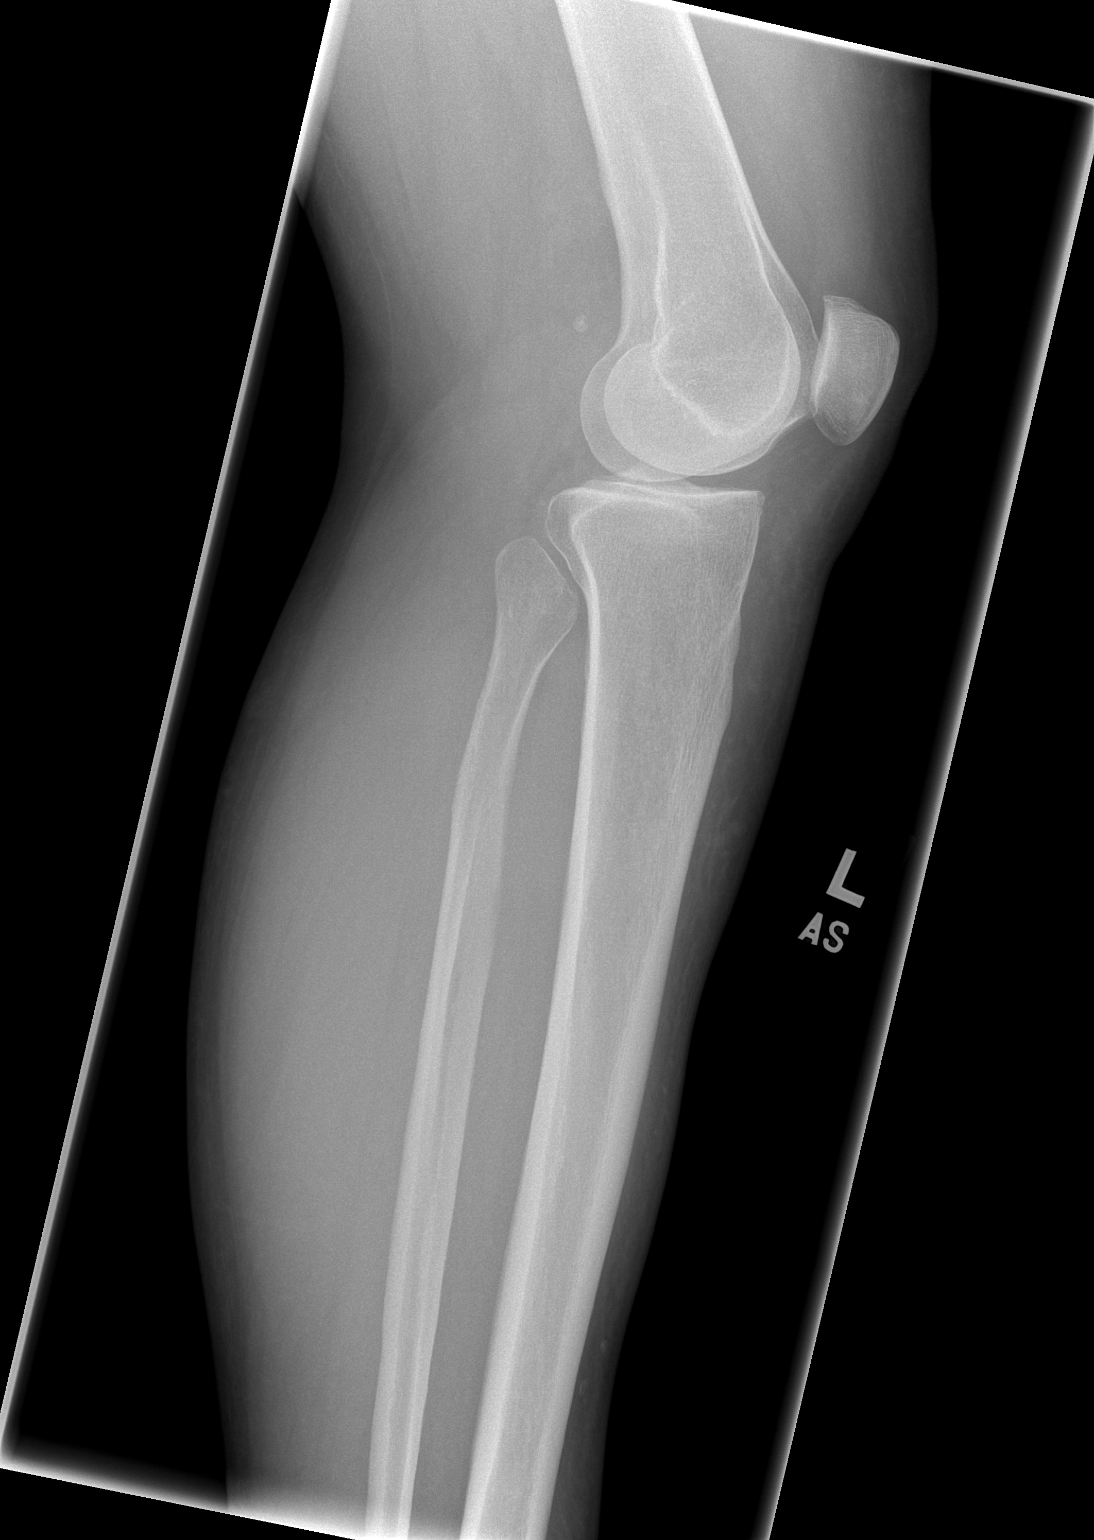

[t tib/fib lat left (2 of 2)]
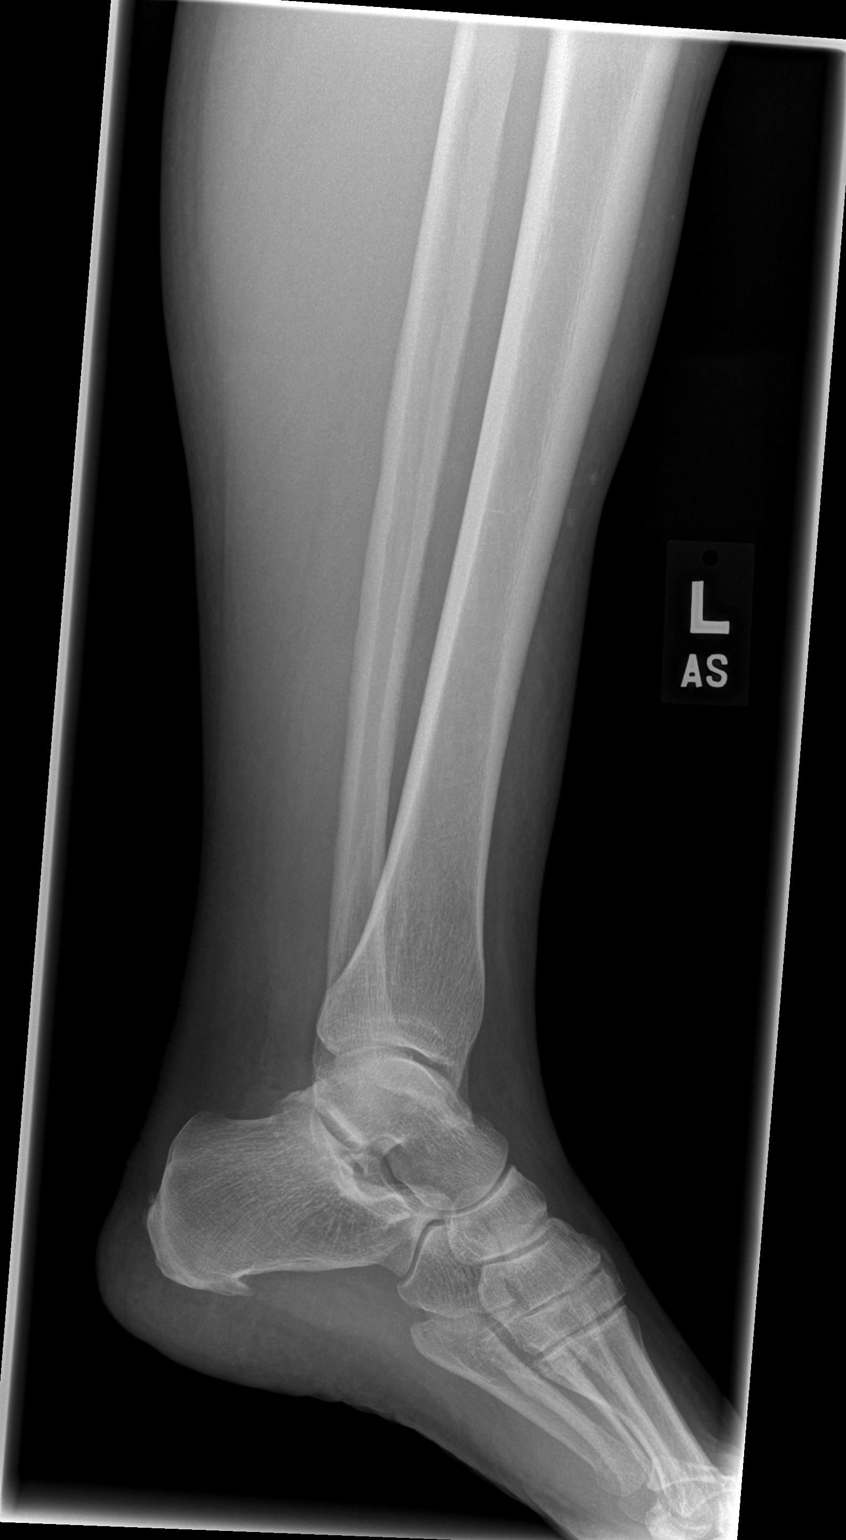

[4 of 4 positions shown; findings below may reference images not displayed]

FINDINGS: The shafts of the left tibia and fibula are intact. There is no
fracture line nor periosteal reaction. The ankle joint mortise is
preserved. Minimal degenerative type change associated with the
medial malleolus is demonstrated.
IMPRESSION: There is no acute or healing fracture of the left tibia or fibula.

## 2015-09-04 ENCOUNTER — Encounter: Payer: Self-pay | Admitting: Neurology

## 2015-09-04 ENCOUNTER — Ambulatory Visit (INDEPENDENT_AMBULATORY_CARE_PROVIDER_SITE_OTHER): Payer: BLUE CROSS/BLUE SHIELD | Admitting: Neurology

## 2015-09-04 VITALS — BP 155/84 | HR 61 | Ht 64.0 in | Wt 187.0 lb

## 2015-09-04 DIAGNOSIS — R569 Unspecified convulsions: Secondary | ICD-10-CM | POA: Diagnosis not present

## 2015-09-04 DIAGNOSIS — G43709 Chronic migraine without aura, not intractable, without status migrainosus: Secondary | ICD-10-CM

## 2015-09-04 MED ORDER — TOPIRAMATE 100 MG PO TABS: ORAL_TABLET | ORAL | Status: DC

## 2015-09-04 NOTE — Progress Notes (Signed)
Chief Complaint  Patient presents with  . Possible Seizures    She had an event, [place] where she felt pressure in her head and her eyes started rapidly, twitching side to side.  Says symptoms lasted several minutes.  She continued to have the pressure in her head for the next several hours.  She has had a total of three episodes.  She has been evaluated by her PCP and in a hosital in [place], [place].  States the hospital ordered a CT and labwork but all tests were normal.  She has also had a normal eye exam.      [facility]  PATIENT: Joy Walsh DOB: 11/21/1960   REASON FOR VISIT: follow up HISTORY FROM: patient  HISTORY OF PRESENT ILLNESS: [name] is a 55 year old right-handed Caucasian female, she is referred by her primary care physician Dr. Hosie Poisson for evaluation of frequent headaches, MRI findings of left frontal parasagittal meningioma   She has a history of migraine headaches, there was typical migraine headaches preceding with visual aura, but is rare happened, in August 23, 2011, she had a quick building up of severe pounding headaches, bilateral retro-orbital, with associated nausea, improved after overnight sleep, but she had mild degree of nausea ever since, she denies lateralized motor or sensory deficit, she had light streak in the right temporal peripheral visual field, was evaluated by an ophthalmologist recently, was told to have right vitreous dettachment.   Ever since the severe migraine in December 2012, she has frequent nausea, mild bilateral frontal pressure headaches, she had MRI of the brain without contrast in September 07 2011, which demonstrated 11 mm left frontal parasagittal meningioma, contrast enhancement, broad-based close to the beginning of the superior sagittal sinus. MRV was normal. She was given a prescription of Zofran, she has not tried it yet, Excedrin Migraine as needed basis.   UPDATE 06/19/13: Patient  comes to office requesting revisit after recent (5 weeks ago) experiencing an audible "pop" in her head, then having right ear pain and pain in the right side of her tongue with the feeling that it was thick or swollen for 4-5 days. She felt like her speech was slurred during that time as well. Her family members noticed some difficulty with her speech. She did not seek medical attention. It resolved fully within 5 days. She still has headaches as before, but denies nausea or light sensitivity with them, they do not keep her from her usual activities.   Repeat MRI 05/2013: 1.6 x 1.2 x 1.2 cm left medial frontal convexity meningioma without surrounding mass effect, edema or midline shift. It appears to be slightly increased in size compared with previous study dated 09/07/2011.  Marland Kitchen  She is requesting imaging be sent to Dr. Luiz Ochoa at [facility], she had consulted him regarding possible treatment with gamma knife.  She had had no recurrent symptoms of numbness or swelling in her tongue.  Headaches are occasional; no worse.    UPDATE Jan 12th 2016: She continue to has frequent right-sided headaches, retro-orbital area moderate pounding headaches, with associated light noise sensitivity, occasionally nauseous, lasting for a few hours, to whole day, she takes Excedrin Migraine as needed, which has been helpful, She was seen by [facility] neurosurgeon Dr. Domingo Cocking, deemed not to be a surgical candidate  She does not want take any prescription medications for her headaches  UPDATE Sep 04 2015: On Aug 20 2015, she [place], suddenly, she felt pressure  in her head, she felt her eyes jumping back and forth, lasting for one minute, she has trouble focusing, seeing things, after that she felt heavy pressure in her eyes for one hour. There was no abnormalities found by PCP ophthalmology examination  On Aug 26 2015, she was sitting on the couch, she felt the pressure across her head, then she  was noted to have eye jumping movement, lasted for one minute, she felt pressure in her head for 2-3 hours, then went way,  She also has mild balance issues.  She has migraines every other week, excerdrine migraine prn was helpful.    On Dec 27, she is going to restaurant, she got out of the car, she felt pressure in her head, dragging her right leg, lasting for few hours, she was also noted that she was staring at her plane, not responded to name calling.  REVIEW OF SYSTEMS: Full 14 system review of systems performed and notable only for: Blurred vision, dizziness, seizure   ALLERGIES: Allergies  Allergen Reactions  . Sulfa Antibiotics Rash    HOME MEDICATIONS: Outpatient Prescriptions Prior to Visit  Medication Sig Dispense Refill  . aspirin-acetaminophen-caffeine (EXCEDRIN MIGRAINE) 250-250-65 MG per tablet Take 1 tablet by mouth every 6 (six) hours as needed for pain.    . bisoprolol-hydrochlorothiazide (ZIAC) 10-6.25 MG per tablet Take 1 tablet by mouth daily.    Marland Kitchen ibuprofen (ADVIL,MOTRIN) 200 MG tablet Take 200 mg by mouth every 6 (six) hours as needed for pain.    . Multiple Vitamin (MULTIVITAMIN) capsule Take 1 capsule by mouth daily.    Marland Kitchen omeprazole (PRILOSEC) 10 MG capsule Take 10 mg by mouth daily.     No facility-administered medications prior to visit.    PAST MEDICAL HISTORY: Past Medical History  Diagnosis Date  . Meningioma ([place])   . Migraine   . Hypertension     PAST SURGICAL HISTORY: Past Surgical History  Procedure Laterality Date  . None      FAMILY HISTORY: Family History  Problem Relation Age of Onset  . Heart Problems Father     SOCIAL HISTORY: Social History   Social History  . Marital Status: Single    Spouse Name: N/A  . Number of Children: 5  . Years of Education: college   Occupational History  .      Self Employed   Social History Main Topics  . Smoking status: Never Smoker   . Smokeless tobacco: Never Used  . Alcohol Use: Yes      Comment: 1-2 weekly  . Drug Use: No  . Sexual Activity: No   Other Topics Concern  . Not on file   Social History Narrative   Pt lives at home with her roommate. Pt has her masters's degree. Pt has five children. Pt denies any history of tobacco and illicit drugs. Pt drinks alcohol once a week.   Caffeine Use: 3-4 times weekly   PHYSICAL EXAM  Filed Vitals:   09/04/15 0839  BP: 155/84  Pulse: 61  Height: 5\' 4"  (1.626 m)  Weight: 187 lb (84.823 kg)   Body mass index is 32.08 kg/(m^2).   PHYSICAL EXAMNIATION:  Gen: NAD, conversant, well nourised, obese, well groomed                     Cardiovascular: Regular rate rhythm, no peripheral edema, warm, nontender. Eyes: Conjunctivae clear without exudates or hemorrhage Neck: Supple, no carotid bruise. Pulmonary: Clear to auscultation bilaterally  NEUROLOGICAL EXAM:  MENTAL STATUS: Speech:    Speech is normal; fluent and spontaneous with normal comprehension.  Cognition:     Orientation to time, place and person     Normal recent and remote memory     Normal Attention span and concentration     Normal Language, naming, repeating,spontaneous speech     Fund of knowledge   CRANIAL NERVES: CN II: Visual fields are full to confrontation. Fundoscopic exam is normal with sharp discs and no vascular changes. Pupils are round equal and briskly reactive to light. CN III, IV, VI: extraocular movement are normal. No ptosis. CN V: Facial sensation is intact to pinprick in all 3 divisions bilaterally. Corneal responses are intact.  CN VII: Face is symmetric with normal eye closure and smile. CN VIII: Hearing is normal to rubbing fingers CN IX, X: Palate elevates symmetrically. Phonation is normal. CN XI: Head turning and shoulder shrug are intact CN XII: Tongue is midline with normal movements and no atrophy.  MOTOR: There is no pronator drift of out-stretched arms. Muscle bulk and tone are normal. Muscle strength is  normal.  REFLEXES: Reflexes are 2+ and symmetric at the biceps, triceps, knees, and ankles. Plantar responses are flexor.  SENSORY: Intact to light touch, pinprick, position sense, and vibration sense are intact in fingers and toes.  COORDINATION: Rapid alternating movements and fine finger movements are intact. There is no dysmetria on finger-to-nose and heel-knee-shin.    GAIT/STANCE: Posture is normal. Gait is steady with normal steps, base, arm swing, and turning. Heel and toe walking are normal. Tandem gait is normal.  Romberg is absent.    DIAGNOSTIC DATA (LABS, IMAGING, TESTING) - I reviewed patient records, labs, notes, testing and imaging myself where available.  09/07/11 MRI BRAIN W/WO  Normal appearance of the brain itself. Likely incidental 11 mm meningioma in the medial left frontal convexity region adjacent to the beginning of the sagittal sinus with a broad base along the falx.  09/07/11 MRV of the Head  Patent large and deep cerebral venous system without evidence of thrombosis. 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  07/03/13 MRI BRAIN W/WO  Abnormal MRI scan of the brain showing 1.6 x 1.2 x 1.2 cm left medial frontal convexity meningioma without surrounding mass effect, edema or midline shift. It appears to be slightly increased in size compared with previous study dated 09/07/2011.  ASSESSMENT AND PLAN 55 years old right-handed Caucasian female, with past medical history of migraine headaches, incidental findings of left frontal parasagittal meningioma, repeat yearly scan has been stable, most recent one was November 2015, she was also seen by [facility] neurosurgeon Dr. Domingo Cocking, not surgical candidate   Migraine headache Left frontal parasagittal meningioma New-onset event suggestive of partial seizure  Complete evaluation with MRI of brain with and without contrast  EEG  Start Topamax titrating to 100 mg twice a day  Marcial Pacas,  M.D. Ph.D.  [facility] [place], [place] [address] Phone: [phone] Fax:      [phone]

## 2015-09-18 ENCOUNTER — Ambulatory Visit (INDEPENDENT_AMBULATORY_CARE_PROVIDER_SITE_OTHER): Payer: BLUE CROSS/BLUE SHIELD

## 2015-09-18 DIAGNOSIS — R569 Unspecified convulsions: Secondary | ICD-10-CM

## 2015-09-19 ENCOUNTER — Telehealth: Payer: Self-pay | Admitting: Neurology

## 2015-09-19 MED ORDER — GADOPENTETATE DIMEGLUMINE 469.01 MG/ML IV SOLN: 18.0000 mL | Freq: Once | INTRAVENOUS | Status: AC | PRN

## 2015-09-19 NOTE — Telephone Encounter (Signed)
She took Topamax 50mg  BID for nine days and decided to stop the medication due to intolerable side effects.  She felt "spacey" and was having "staring spells".  She is also concerned about a loss of hearing in her right ear, feeling of heaviness in her face and tongue (no visible swelling or drooping).

## 2015-09-19 NOTE — Telephone Encounter (Signed)
I have called her, MRI brain report is still pending, I personally reviewed MRI, continued evidence of small left parasagittal meningioma, no acute intracranial abnormality,  Patient could not tolerate Topamax, difficulty thinking, difficulty focusing, is no longer taking it. She is not interested in trying any new medications  She also complains few days history of sudden onset right hearing loss, was evaluated by urgent care with no significant abnormality, I have suggested her to follow-up with ENT, continue EEG, follow-up appointment as previously scheduled

## 2015-09-19 NOTE — Telephone Encounter (Signed)
Patient is calling because about 4 days ago she lost hearing in her right ear, last night she noticed a weird feeling in her face and feels like her tongue is swollen. She states she stopped taking topiramate (TOPAMAX) 100 MG tablet the middle of last week. Please call to discuss.

## 2015-09-19 NOTE — Telephone Encounter (Signed)
Pt called requesting MRI results.  

## 2015-09-24 ENCOUNTER — Telehealth: Payer: Self-pay | Admitting: Neurology

## 2015-09-24 MED ORDER — TOPIRAMATE 25 MG PO TABS: 25.0000 mg | ORAL_TABLET | Freq: Two times a day (BID) | ORAL | Status: DC

## 2015-09-24 NOTE — Telephone Encounter (Signed)
Patient would like to restart at a low dose, how do you want to proceed?

## 2015-09-24 NOTE — Telephone Encounter (Signed)
Spoke to patient - she would like to try topiramate 25mg , BID.  Ok per Dr. Krista Blue to send in new rx.  She will keep her EEG appt on 1/31/7 and her follow up appt on 10/10/15.

## 2015-09-24 NOTE — Addendum Note (Signed)
Addended by: Noberto Retort C on: 09/24/2015 04:18 PM   Modules accepted: Orders

## 2015-09-24 NOTE — Telephone Encounter (Signed)
Patient is calling and states that she had to come off her Rx Topiramate 100 mg because of the side affects but is now having seizures again and needs to go back on it.  She would like to know if she can have a Rx Topiramate  25 mg so that she can get started back slowly on the medication.  Please call.

## 2015-09-24 NOTE — Telephone Encounter (Signed)
Left message for a return call

## 2015-09-24 NOTE — Telephone Encounter (Signed)
Please advise patient, she can take Topamax 100 mg half tablet every night for one week, then 1 tablet every night for one week, then half tablet in the morning, 1 tablet at evening for 1 week, eventually 1 tablet twice a day,

## 2015-09-24 NOTE — Telephone Encounter (Signed)
Patient is returning your call.    Thanks

## 2015-10-01 ENCOUNTER — Ambulatory Visit (INDEPENDENT_AMBULATORY_CARE_PROVIDER_SITE_OTHER): Payer: BLUE CROSS/BLUE SHIELD | Admitting: Neurology

## 2015-10-01 DIAGNOSIS — R569 Unspecified convulsions: Secondary | ICD-10-CM | POA: Diagnosis not present

## 2015-10-02 ENCOUNTER — Other Ambulatory Visit: Payer: Self-pay | Admitting: Family Medicine

## 2015-10-02 DIAGNOSIS — R4781 Slurred speech: Secondary | ICD-10-CM

## 2015-10-03 ENCOUNTER — Telehealth: Payer: Self-pay | Admitting: Neurology

## 2015-10-03 NOTE — Telephone Encounter (Signed)
Patient is aware of results.

## 2015-10-03 NOTE — Procedures (Signed)
   HISTORY: 55 years old female, with history of headaches, episode of speech disturbance   TECHNIQUE:  16 channel EEG was performed based on standard 10-16 international system. One channel was dedicated to EKG, which has demonstrates normal sinus rhythm of  60 beats per minutes.  Upon awakening, the posterior background activity was well-developed, in alpha range,10 Hz,  with amplitude of 35 microvoltage, reactive to eye opening and closure.  There was no evidence of epileptiform discharge.  Photic stimulation was performed, which induced a symmetric photic driving.  Hyperventilation was performed, there was no abnormality elicit. Stage II  sleep was achieved.  CONCLUSION: This is a  normal  awake and asleep EEG.  There is no electrodiagnostic evidence of epileptiform discharge

## 2015-10-03 NOTE — Telephone Encounter (Signed)
Patient called to request results of EEG.

## 2015-10-03 NOTE — Telephone Encounter (Signed)
Please call patient for normal EEG

## 2015-10-03 NOTE — Telephone Encounter (Signed)
EEG complete on 10/01/15.

## 2015-10-04 ENCOUNTER — Other Ambulatory Visit (HOSPITAL_COMMUNITY): Payer: Self-pay | Admitting: Family Medicine

## 2015-10-04 DIAGNOSIS — R2689 Other abnormalities of gait and mobility: Secondary | ICD-10-CM

## 2015-10-10 ENCOUNTER — Ambulatory Visit
Admission: RE | Admit: 2015-10-10 | Discharge: 2015-10-10 | Disposition: A | Discharge status: Home / Self Care | Payer: BLUE CROSS/BLUE SHIELD | Source: Ambulatory Visit | Attending: Family Medicine | Admitting: Family Medicine

## 2015-10-10 ENCOUNTER — Ambulatory Visit (INDEPENDENT_AMBULATORY_CARE_PROVIDER_SITE_OTHER): Payer: BLUE CROSS/BLUE SHIELD | Admitting: Neurology

## 2015-10-10 ENCOUNTER — Encounter: Payer: Self-pay | Admitting: Neurology

## 2015-10-10 VITALS — BP 122/73 | HR 72 | Ht 64.0 in | Wt 193.0 lb

## 2015-10-10 DIAGNOSIS — G43709 Chronic migraine without aura, not intractable, without status migrainosus: Secondary | ICD-10-CM

## 2015-10-10 DIAGNOSIS — R4781 Slurred speech: Secondary | ICD-10-CM

## 2015-10-10 DIAGNOSIS — D320 Benign neoplasm of cerebral meninges: Secondary | ICD-10-CM | POA: Diagnosis not present

## 2015-10-10 NOTE — Progress Notes (Signed)
Chief Complaint  Patient presents with  . Seizures    She was unable to tolerate the side effects of Topamax.  She has not had any further events.  She would like to review her EEG.     Chief Complaint  Patient presents with  . Seizures    She was unable to tolerate the side effects of Topamax.  She has not had any further events.  She would like to review her EEG.        [facility]  PATIENT: Joy Walsh DOB: 06-03-1961   HISTORY OF PRESENT ILLNESS: Lynnia is a 55 year old right-handed Caucasian female, she is referred by her primary care physician Dr. Hosie Poisson for evaluation of frequent headaches, MRI findings of left frontal parasagittal meningioma   She has a history of migraine headaches, there was typical migraine headaches preceding with visual aura, but is rare happened, in August 23, 2011, she had a quick building up of severe pounding headaches, bilateral retro-orbital, with associated nausea, improved after overnight sleep, but she had mild degree of nausea ever since, she denies lateralized motor or sensory deficit, she had light streak in the right temporal peripheral visual field, was evaluated by an ophthalmologist recently, was told to have right vitreous dettachment.   Ever since the severe migraine in December 2012, she has frequent nausea, mild bilateral frontal pressure headaches, she had MRI of the brain without contrast in September 07 2011, which demonstrated 11 mm left frontal parasagittal meningioma, contrast enhancement, broad-based close to the beginning of the superior sagittal sinus. MRV was normal. She was given a prescription of Zofran, she has not tried it yet, Excedrin Migraine as needed basis.   UPDATE 06/19/13: Patient comes to office requesting revisit after recent (5 weeks ago) experiencing an audible "pop" in her head, then having right ear pain and pain in the right side of her tongue with the feeling that it was thick or swollen  for 4-5 days. She felt like her speech was slurred during that time as well. Her family members noticed some difficulty with her speech. She did not seek medical attention. It resolved fully within 5 days. She still has headaches as before, but denies nausea or light sensitivity with them, they do not keep her from her usual activities.   Repeat MRI 05/2013: 1.6 x 1.2 x 1.2 cm left medial frontal convexity meningioma without surrounding mass effect, edema or midline shift. It appears to be slightly increased in size compared with previous study dated 09/07/2011.  Marland Kitchen  She is requesting imaging be sent to Dr. Luiz Ochoa at [facility], she had consulted him regarding possible treatment with gamma knife.  She had had no recurrent symptoms of numbness or swelling in her tongue.  Headaches are occasional; no worse.    UPDATE Jan 12th 2016: She continue to has frequent right-sided headaches, retro-orbital area moderate pounding headaches, with associated light noise sensitivity, occasionally nauseous, lasting for a few hours, to whole day, she takes Excedrin Migraine as needed, which has been helpful, She was seen by [facility] neurosurgeon Dr. Domingo Cocking, deemed not to be a surgical candidate  She does not want take any prescription medications for her headaches  UPDATE Sep 04 2015: On Aug 20 2015, [place], suddenly, she felt pressure in her head, she felt her eyes jumping back and forth, lasting for one minute, she has trouble focusing, seeing things, after that she felt heavy pressure in her eyes for one hour.  There was no abnormalities found by PCP ophthalmology examination  On Aug 26 2015, she was sitting on the couch, she felt the pressure across her head, then she was noted to have eye jumping movement, lasted for one minute, she felt pressure in her head for 2-3 hours, then went way,  She also has mild balance issues.  She has migraines every other week, excerdrine migraine  prn was helpful.    On Dec 27, she is going to restaurant, she got out of the car, she felt pressure in her head, dragging her right leg, lasting for few hours, she was also noted that she was staring at her plate not responded to name calling.  Update October 10 2015: She tried Topamax 50 mg twice a day for a few days, could not tolerate the medications due to the side effect of slurring of the world, confusion.  Last spell was in Jan 20th 2017, pressure in her head, visual distortion, as if she is looking for a bubble, she denies light noise sensitivity, denies nausea,  We have personally reviewed MRI of the brain with and without contrast in January 2017, in compared to previous scan in 2013, continued evidence of left frontal meningioma noted measuring 11x12x32mm, no significant change compared to previous scan   REVIEW OF SYSTEMS: Full 14 system review of systems performed and notable only for: Blurred vision, dizziness, seizure   ALLERGIES: Allergies  Allergen Reactions  . Amoxicillin-Pot Clavulanate     Other reaction(s): GI Intolerance  . Sulfa Antibiotics Rash    HOME MEDICATIONS: Outpatient Prescriptions Prior to Visit  Medication Sig Dispense Refill  . aspirin-acetaminophen-caffeine (EXCEDRIN MIGRAINE) 250-250-65 MG per tablet Take 1 tablet by mouth every 6 (six) hours as needed for pain.    . bisoprolol-hydrochlorothiazide (ZIAC) 10-6.25 MG per tablet Take 1 tablet by mouth daily.    Marland Kitchen ibuprofen (ADVIL,MOTRIN) 200 MG tablet Take 200 mg by mouth every 6 (six) hours as needed for pain.    . Multiple Vitamin (MULTIVITAMIN) capsule Take 1 capsule by mouth daily.    Marland Kitchen omeprazole (PRILOSEC) 10 MG capsule Take 10 mg by mouth daily.    Marland Kitchen topiramate (TOPAMAX) 25 MG tablet Take 1 tablet (25 mg total) by mouth 2 (two) times daily. 60 tablet 0   Facility-Administered Medications Prior to Visit  Medication Dose Route Frequency Provider Last Rate Last Dose  . gadopentetate  dimeglumine (MAGNEVIST) injection 18 mL  18 mL Intravenous Once PRN Marcial Pacas, MD        PAST MEDICAL HISTORY: Past Medical History  Diagnosis Date  . Meningioma ([place])   . Migraine   . Hypertension     PAST SURGICAL HISTORY: Past Surgical History  Procedure Laterality Date  . None      FAMILY HISTORY: Family History  Problem Relation Age of Onset  . Heart Problems Father     SOCIAL HISTORY: Social History   Social History  . Marital Status: Single    Spouse Name: N/A  . Number of Children: 5  . Years of Education: college   Occupational History  .      Self Employed   Social History Main Topics  . Smoking status: Never Smoker   . Smokeless tobacco: Never Used  . Alcohol Use: Yes     Comment: 1-2 weekly  . Drug Use: No  . Sexual Activity: No   Other Topics Concern  . Not on file   Social History Narrative   Pt lives  at home with her roommate. Pt has her masters's degree. Pt has five children. Pt denies any history of tobacco and illicit drugs. Pt drinks alcohol once a week.   Caffeine Use: 3-4 times weekly   PHYSICAL EXAM  Filed Vitals:   10/10/15 0800  BP: 122/73  Pulse: 72  Height: 5\' 4"  (1.626 m)  Weight: 193 lb (87.544 kg)   Body mass index is 33.11 kg/(m^2).   PHYSICAL EXAMNIATION:  Gen: NAD, conversant, well nourised, obese, well groomed                     Cardiovascular: Regular rate rhythm, no peripheral edema, warm, nontender. Eyes: Conjunctivae clear without exudates or hemorrhage Neck: Supple, no carotid bruise. Pulmonary: Clear to auscultation bilaterally   NEUROLOGICAL EXAM:  MENTAL STATUS: Speech:    Speech is normal; fluent and spontaneous with normal comprehension.  Cognition:     Orientation to time, place and person     Normal recent and remote memory     Normal Attention span and concentration     Normal Language, naming, repeating,spontaneous speech     Fund of knowledge   CRANIAL NERVES: CN II: Visual fields are  full to confrontation. Fundoscopic exam is normal with sharp discs and no vascular changes. Pupils are round equal and briskly reactive to light. CN III, IV, VI: extraocular movement are normal. No ptosis. CN V: Facial sensation is intact to pinprick in all 3 divisions bilaterally. Corneal responses are intact.  CN VII: Face is symmetric with normal eye closure and smile. CN VIII: Hearing is normal to rubbing fingers CN IX, X: Palate elevates symmetrically. Phonation is normal. CN XI: Head turning and shoulder shrug are intact CN XII: Tongue is midline with normal movements and no atrophy.  MOTOR: There is no pronator drift of out-stretched arms. Muscle bulk and tone are normal. Muscle strength is normal.  REFLEXES: Reflexes are 2+ and symmetric at the biceps, triceps, knees, and ankles. Plantar responses are flexor.  SENSORY: Intact to light touch, pinprick, position sense, and vibration sense are intact in fingers and toes.  COORDINATION: Rapid alternating movements and fine finger movements are intact. There is no dysmetria on finger-to-nose and heel-knee-shin.    GAIT/STANCE: Posture is normal. Gait is steady with normal steps, base, arm swing, and turning. Heel and toe walking are normal. Tandem gait is normal.  Romberg is absent.    DIAGNOSTIC DATA (LABS, IMAGING, TESTING) - I reviewed patient records, labs, notes, testing and imaging myself where available.  09/07/11 MRI BRAIN W/WO  Normal appearance of the brain itself. Likely incidental 11 mm meningioma in the medial left frontal convexity region adjacent to the beginning of the sagittal sinus with a broad base along the falx.  09/07/11 MRV of the Head  Patent large and deep cerebral venous system without evidence of thrombosis. 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  07/03/13 MRI BRAIN W/WO  Abnormal MRI scan of the brain showing 1.6 x 1.2 x 1.2 cm left medial frontal convexity  meningioma without surrounding mass effect, edema or midline shift. It appears to be slightly increased in size compared with previous study dated 09/07/2011.  ASSESSMENT AND PLAN 55 years old right-handed Caucasian female, with past medical history of migraine headaches, incidental findings of left frontal parasagittal meningioma, repeat yearly scan has been stable, most recent one was November 2015, she was also seen by [facility] neurosurgeon Dr. Domingo Cocking, not surgical candidate  Migraine headache  Excedrin Migraine as needed  Left frontal parasagittal meningioma  No change on repeat MRI brain scan    Face to face time was 25 minutes, greater than 50% of the time was spent in counseling and coordination of care with the patient   Marcial Pacas, M.D. Ph.D.  [facility] [place], [place] [address] Phone: [phone] Fax:      [phone]

## 2015-10-14 ENCOUNTER — Other Ambulatory Visit: Payer: BLUE CROSS/BLUE SHIELD

## 2015-10-18 ENCOUNTER — Other Ambulatory Visit: Payer: Self-pay

## 2015-10-18 ENCOUNTER — Ambulatory Visit (HOSPITAL_COMMUNITY): Payer: BLUE CROSS/BLUE SHIELD | Attending: Cardiology

## 2015-10-18 DIAGNOSIS — I517 Cardiomegaly: Secondary | ICD-10-CM | POA: Diagnosis not present

## 2015-10-18 DIAGNOSIS — I071 Rheumatic tricuspid insufficiency: Secondary | ICD-10-CM | POA: Insufficient documentation

## 2015-10-18 DIAGNOSIS — R2689 Other abnormalities of gait and mobility: Secondary | ICD-10-CM | POA: Diagnosis present

## 2015-11-05 ENCOUNTER — Encounter: Payer: Self-pay | Admitting: Neurology

## 2015-11-05 ENCOUNTER — Ambulatory Visit (INDEPENDENT_AMBULATORY_CARE_PROVIDER_SITE_OTHER): Payer: BLUE CROSS/BLUE SHIELD | Admitting: Neurology

## 2015-11-05 VITALS — BP 124/72 | HR 64 | Resp 16 | Ht 65.0 in | Wt 190.0 lb

## 2015-11-05 DIAGNOSIS — G43009 Migraine without aura, not intractable, without status migrainosus: Secondary | ICD-10-CM | POA: Insufficient documentation

## 2015-11-05 DIAGNOSIS — R404 Transient alteration of awareness: Secondary | ICD-10-CM | POA: Insufficient documentation

## 2015-11-05 NOTE — Patient Instructions (Signed)
1. Schedule 48-hour EEG 2. Keep a calendar of your symptoms and potential triggers 3. As per [place] driving laws, after an episode of loss of awareness, one should not drive until 6 months event-free 4. Follow-up after EEG

## 2015-11-05 NOTE — Progress Notes (Signed)
NEUROLOGY CONSULTATION NOTE  Jillana Dibert MRN: [id number] DOB: April 23, 1961  Referring provider: Dr. Maurice Small Primary care provider: Dr. Maurice Small  Reason for consult:  spells  Dear Dr Justin Mend:  Thank you for your kind referral of Ilhan Treto for consultation of the above symptoms. Although her history is well known to you, please allow me to reiterate it for the purpose of our medical record. Records and images were personally reviewed where available.  HISTORY OF PRESENT ILLNESS: This is a pleasant 55 year old right-handed woman with a history of migraines, small left frontal meningioma, hypertension, presenting for evaluation of recurrent episodes of head pressure with staring/unresponsiveness and "eyes jumping" since December 2016. She reports the first episode occurred 08/20/15 while driving, she suddenly had a strange pressure sensation on one side of her head pushing across to the other side. She then felt like her eyes were jumping back and forth quickly. She tried to focus to get off the road, with the eye symptoms lasting a minute or so, but she continued to feel weird for about an hour after. She was shaky all over because this scared her, but denied any confusion, focal numbness/tingling/weakness. The second episode occurred 08/26/15, she started having the exact same pressure feeling in her head while sitting on the couch, followed 15 seconds later by her "eyes jumping all around." Her 54 year old daughter confirmed that her eyes were moving around quickly. This lasted a minute or so, but again the weird feeling lasted an hour. She reports that she had seen her PCP around 2 weeks prior these episodes because she was having balance issues and feeling that something was pushing her to one side while walking. She was told her ears were fine. The third episode occurred on 08/27/15, she again had the weird pressure in her head walking into a restaurant. Her mother then told her that she  had called her twice and she was not responding. She does not recall this and feels like she lost a tiny block of time. She continued to have the weird feeling for 1-2 hours after. On 09/18/15, her roommate told her she was looking right at her but not responding for a few minutes. She had an episode on 09/21/15 while driving her son, she started feeling the same strong pressure and was trying hard to focus on driving because she felt her vision was distorted. This lasted for 45-60 minutes. The last episode occurred on 09/24/15, she again had the head heaviness and felt her eyes were heavy, she felt pressure in her head from right to left pushing her into the wall. She was alone but felt herself staring off, the entire episode lasted 45-60 minutes.  She denies any further "eye jumping" or staring episodes since January, but continues to have the weird head pressure symptoms occurring around twice a week, with her vision getting distorted, and feeling "almost numb and tingly in my head but not my body." She would sit down and close her eyes, and symptoms would fade away after 45 minutes. She reports these head pressure episodes are different from her usual headaches that she has had since teenage years. The chronic headaches are usually in the frontal midline region, with sharp pain and pressure occurring twice a week or so, no associated nausea/vomiting, sometimes with some photosensitivity. They usually last until she goes to sleep or takes Excedrin. If she takes medication early enough it aborts the headache. She denies any olfactory/gustatory hallucinations, deja vu,  rising epigastric sensation, focal numbness/tingling/weakness, myoclonic jerks. She denies any dysarthria, dysphagia, neck/back pain, bowel/bladder dysfunction. She was found to have the small left frontal meningioma in 2013 as part of headache workup, this has been stable with several follow-up MRI scans. I personally reviewed MRI brain with and  without contrast done 09/18/15 which showed the small 11x12x38mm left frontal parasagittal enhancing dural based meningioma. Otherwise no acute changes, hippocampi symmetric without abnormal signal or enhancement. She had carotid dopplers reported mild eccentric smooth plaque in the right ICA with less than 50% stenosis, minimal on the left ICA with less than 50% stenosis. She had seen neurologist Dr. Krista Blue for these symptoms, routine EEG reported normal awake and asleep EEG. She was started on Topamax but only took it for 9 days, reporting significant side effects of feeling "horrible," cognitive changes ("my IQ dropped so low") with memory changes, as well as reporting loss of hearing in the right ear, hair loss, and gum bleeding. These resolved after stopping Topamax.   Epilepsy Risk Factors:  She had a normal birth and early development.  There is no history of febrile convulsions, CNS infections such as meningitis/encephalitis, significant traumatic brain injury, neurosurgical procedures, or family history of seizures.  PAST MEDICAL HISTORY: Past Medical History  Diagnosis Date  . Meningioma ([place])   . Migraine   . Hypertension     PAST SURGICAL HISTORY: Past Surgical History  Procedure Laterality Date  . None      MEDICATIONS: Current Outpatient Prescriptions on File Prior to Visit  Medication Sig Dispense Refill  . aspirin-acetaminophen-caffeine (EXCEDRIN MIGRAINE) 250-250-65 MG per tablet Take 1 tablet by mouth every 6 (six) hours as needed for pain.    . bisoprolol-hydrochlorothiazide (ZIAC) 10-6.25 MG per tablet Take 1 tablet by mouth daily.    Marland Kitchen omeprazole (PRILOSEC) 10 MG capsule Take 10 mg by mouth daily.    Marland Kitchen ibuprofen (ADVIL,MOTRIN) 200 MG tablet Take 200 mg by mouth every 6 (six) hours as needed for pain. Reported on 11/05/2015     Current Facility-Administered Medications on File Prior to Visit  Medication Dose Route Frequency Provider Last Rate Last Dose  . gadopentetate  dimeglumine (MAGNEVIST) injection 18 mL  18 mL Intravenous Once PRN Marcial Pacas, MD        ALLERGIES: Allergies  Allergen Reactions  . Amoxicillin-Pot Clavulanate     Other reaction(s): GI Intolerance  . Sulfa Antibiotics Rash    FAMILY HISTORY: Family History  Problem Relation Age of Onset  . Heart Problems Father     SOCIAL HISTORY: Social History   Social History  . Marital Status: Single    Spouse Name: N/A  . Number of Children: 5  . Years of Education: college   Occupational History  . Real Estate     Self Employed   Social History Main Topics  . Smoking status: Never Smoker   . Smokeless tobacco: Never Used  . Alcohol Use: 0.0 oz/week    0 Standard drinks or equivalent per week     Comment: Occ  . Drug Use: No  . Sexual Activity: No   Other Topics Concern  . Not on file   Social History Narrative   Pt lives at home with her roommate. Pt has her masters's degree. Pt has five children. Pt denies any history of tobacco and illicit drugs. Pt drinks alcohol once a week.   Caffeine Use: 3-4 times weekly    REVIEW OF SYSTEMS: Constitutional: No fevers, chills, or sweats,  no generalized fatigue, change in appetite Eyes: No visual changes, double vision, eye pain Ear, nose and throat: No hearing loss, ear pain, nasal congestion, sore throat Cardiovascular: No chest pain, palpitations Respiratory:  No shortness of breath at rest or with exertion, wheezes GastrointestinaI: No nausea, vomiting, diarrhea, abdominal pain, fecal incontinence Genitourinary:  No dysuria, urinary retention or frequency Musculoskeletal:  No neck pain, back pain Integumentary: No rash, pruritus, skin lesions Neurological: as above Psychiatric: No depression, insomnia, anxiety Endocrine: No palpitations, fatigue, diaphoresis, mood swings, change in appetite, change in weight, increased thirst Hematologic/Lymphatic:  No anemia, purpura, petechiae. Allergic/Immunologic: no itchy/runny eyes,  nasal congestion, recent allergic reactions, rashes  PHYSICAL EXAM: Filed Vitals:   11/05/15 0850  BP: 124/72  Pulse: 64  Resp: 16   General: No acute distress Head:  Normocephalic/atraumatic Eyes: Fundoscopic exam shows bilateral sharp discs, no vessel changes, exudates, or hemorrhages Neck: supple, no paraspinal tenderness, full range of motion Back: No paraspinal tenderness Heart: regular rate and rhythm Lungs: Clear to auscultation bilaterally. Vascular: No carotid bruits. Skin/Extremities: No rash, no edema Neurological Exam: Mental status: alert and oriented to person, place, and time, no dysarthria or aphasia, Fund of knowledge is appropriate.  Recent and remote memory are intact. 2/3 delayed recall.  Attention and concentration are normal.    Able to name objects and repeat phrases. Cranial nerves: CN I: not tested CN II: pupils equal, round and reactive to light, visual fields intact, fundi unremarkable. CN III, IV, VI:  full range of motion, no nystagmus, no ptosis CN V: facial sensation intact CN VII: upper and lower face symmetric CN VIII: hearing intact to finger rub CN IX, X: gag intact, uvula midline CN XI: sternocleidomastoid and trapezius muscles intact CN XII: tongue midline Bulk & Tone: normal, no fasciculations. Motor: 5/5 throughout with no pronator drift. Sensation: intact to light touch, cold, pin, vibration and joint position sense.  No extinction to double simultaneous stimulation.  Romberg test negative Deep Tendon Reflexes: +2 throughout, no ankle clonus Plantar responses: downgoing bilaterally Cerebellar: no incoordination on finger to nose, heel to shin. No dysdiadochokinesia Gait: narrow-based and steady, able to tandem walk adequately. Tremor: none  IMPRESSION: This is a pleasant 55 year old right-handed woman with a history of hypertension, migraines, small left frontal meningioma, presenting with new onset recurrent episodes where she has a  stereotyped pressure sensation in head followed by either episodes of "eye jumping" or staring/unresponsiveness. Last episode was 09/24/15, however she continues to have the head pressure symptoms twice a week without progressing. Her neurological exam is normal. MRI brain showed unchanged meningioma since 2013. Routine EEG normal. The episodes of unresponsiveness are concerning for focal seizures with impaired awareness, other consideration is complicated migraines. She did not tolerate Topamax. A 48-hour EEG will be ordered to further classify these episodes. We discussed that after the EEG, would recommend starting medication to help with both possible seizures and headaches, such as Zonisamide or Depakote. We will discuss this after her EEG. [place] driving laws were discussed with the patient, and she knows to stop driving after an episode of loss of awareness until 6 months event-free. She will follow-up after the EEG.   Thank you for allowing me to participate in the care of this patient. Please do not hesitate to call for any questions or concerns.   Ellouise Newer, M.D.  CC: Dr. Justin Mend

## 2015-11-20 ENCOUNTER — Telehealth: Payer: Self-pay | Admitting: Neurology

## 2015-11-20 NOTE — Telephone Encounter (Signed)
Noted, ok to hold off for now.

## 2015-11-20 NOTE — Telephone Encounter (Signed)
Called to make appointment for 48 hour ambulatory EEG. Patient stated she does not want to have it now because she has not had any seizures since the end of January and does not want to do it if it will not show anything. She asked that I let Dr. Delice Lesch know that she wants to "wait until the episodes start up again" so that they can be captured.

## 2016-01-23 DIAGNOSIS — S30861A Insect bite (nonvenomous) of abdominal wall, initial encounter: Secondary | ICD-10-CM | POA: Diagnosis not present

## 2016-01-23 DIAGNOSIS — W57XXXA Bitten or stung by nonvenomous insect and other nonvenomous arthropods, initial encounter: Secondary | ICD-10-CM | POA: Diagnosis not present

## 2016-01-26 DIAGNOSIS — M9901 Segmental and somatic dysfunction of cervical region: Secondary | ICD-10-CM | POA: Diagnosis not present

## 2016-01-26 DIAGNOSIS — R0789 Other chest pain: Secondary | ICD-10-CM | POA: Diagnosis not present

## 2016-04-07 DIAGNOSIS — H93292 Other abnormal auditory perceptions, left ear: Secondary | ICD-10-CM | POA: Diagnosis not present

## 2016-04-07 DIAGNOSIS — H903 Sensorineural hearing loss, bilateral: Secondary | ICD-10-CM | POA: Diagnosis not present

## 2016-04-07 DIAGNOSIS — H9041 Sensorineural hearing loss, unilateral, right ear, with unrestricted hearing on the contralateral side: Secondary | ICD-10-CM | POA: Diagnosis not present

## 2016-06-05 IMAGING — US US CAROTID DUPLEX BILAT
1 series · 13 of 24 positions shown · non-contrast
Comparison: None.

CLINICAL DATA: Slurred speech.

EXAM:
BILATERAL CAROTID DUPLEX ULTRASOUND
TECHNIQUE: Gray scale imaging, color Doppler and duplex ultrasound were
performed of bilateral carotid and vertebral arteries in the neck.

[Series 1: us carotid duplex bilat · 0.08mm/px · 13 of 91 slices shown]
[im 1/91]
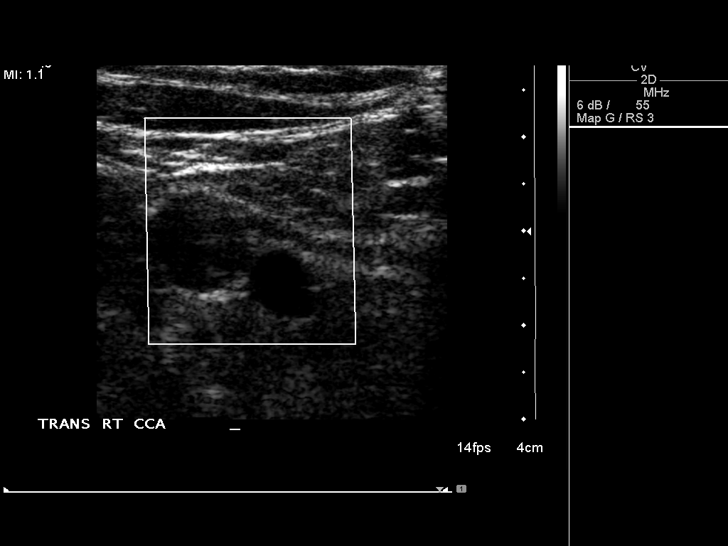
[im 8/91]
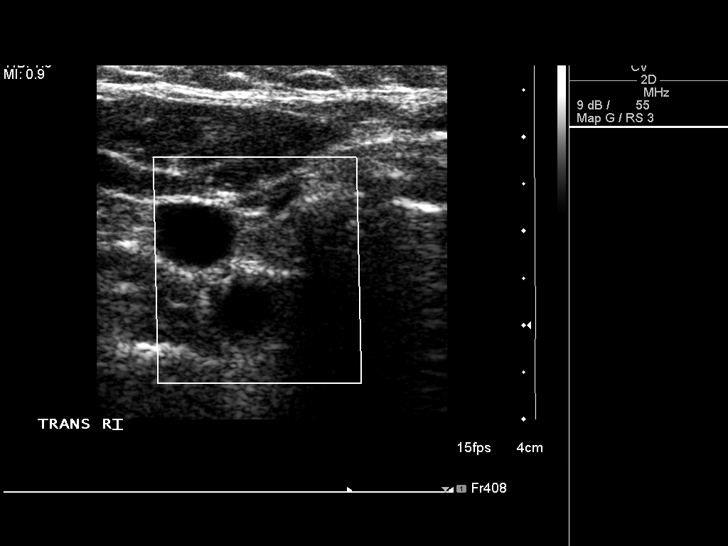
[im 16/91]
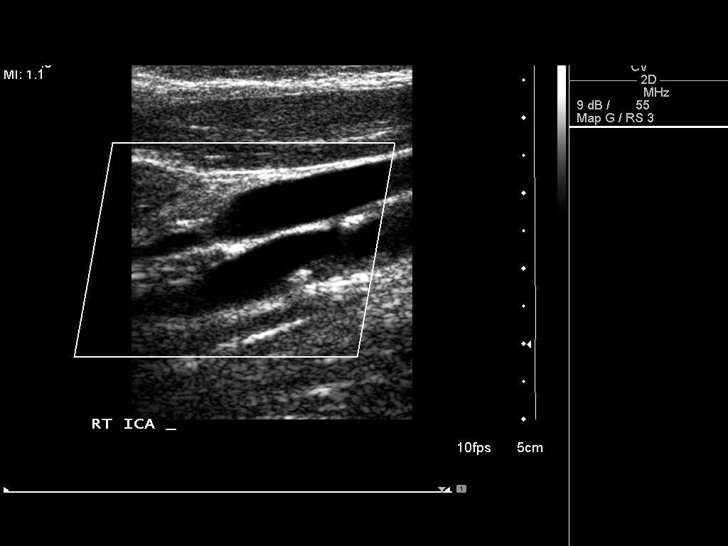
[im 24/91]
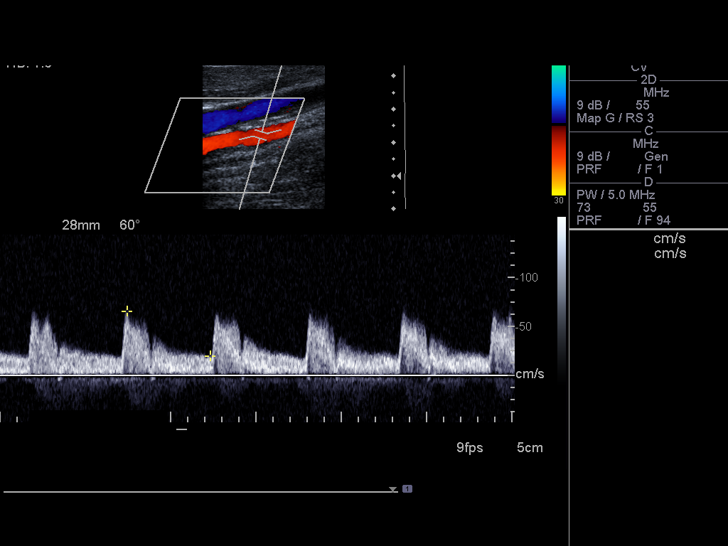
[im 32/91]
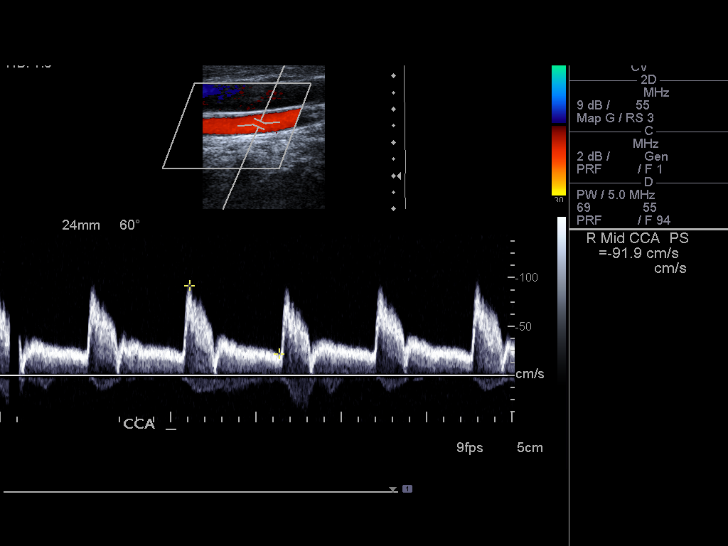
[im 40/91]
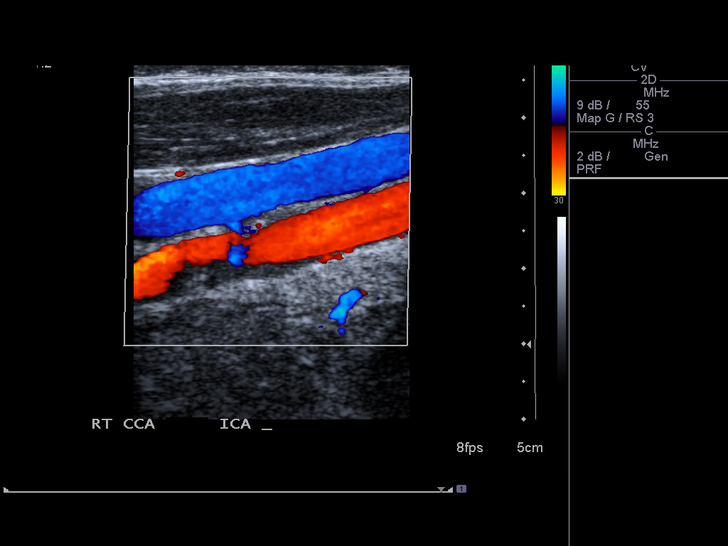
[im 47/91]
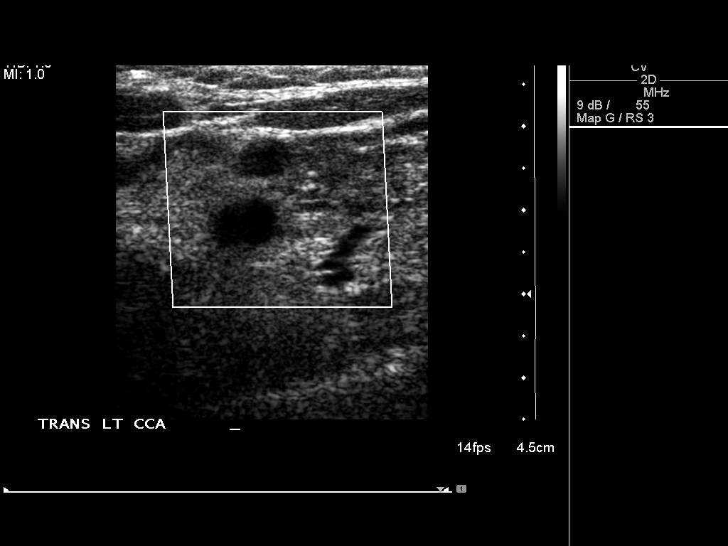
[im 51/91]
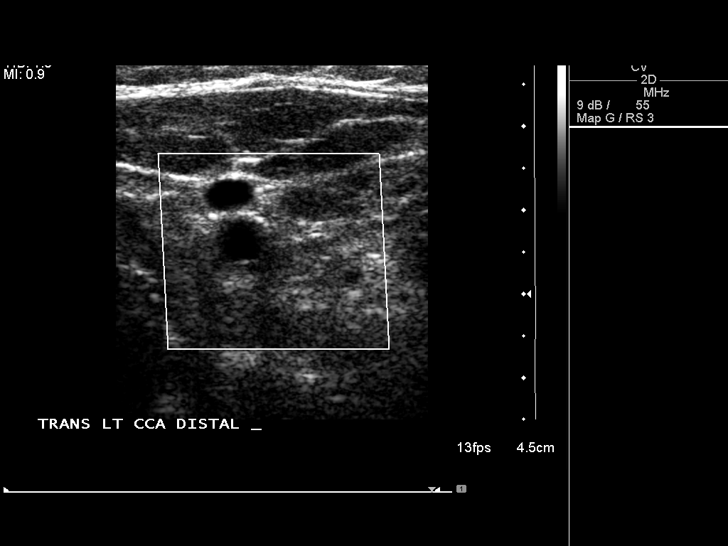
[im 59/91]
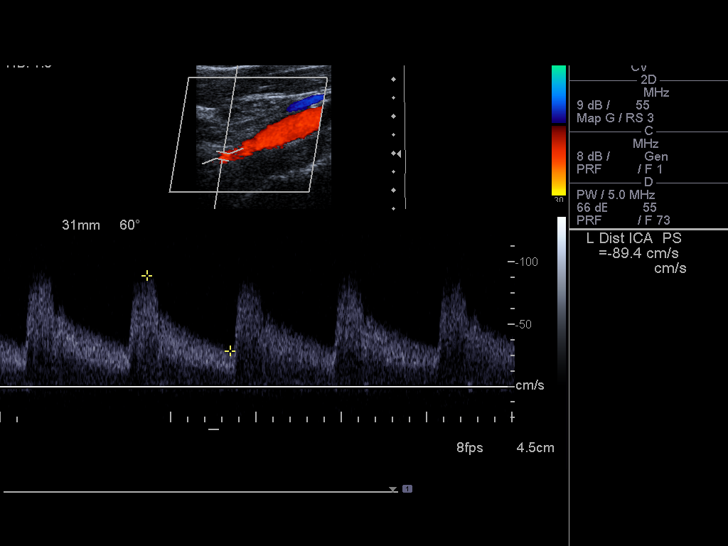
[im 67/91]
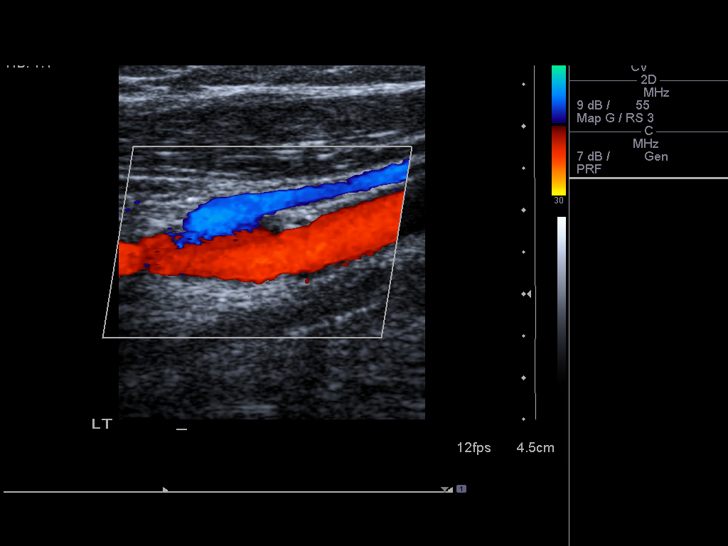
[im 75/91]
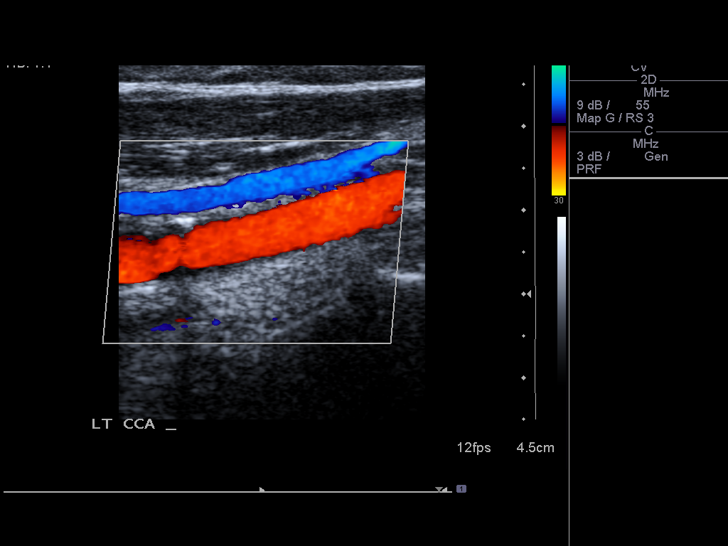
[im 83/91]
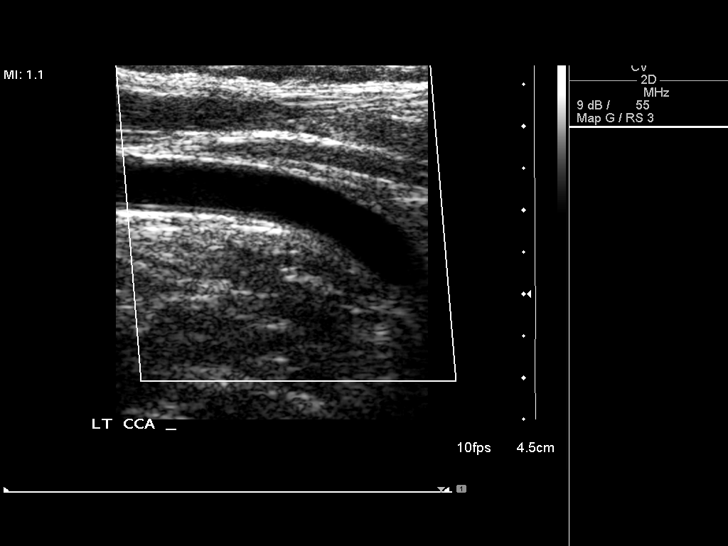
[im 91/91]
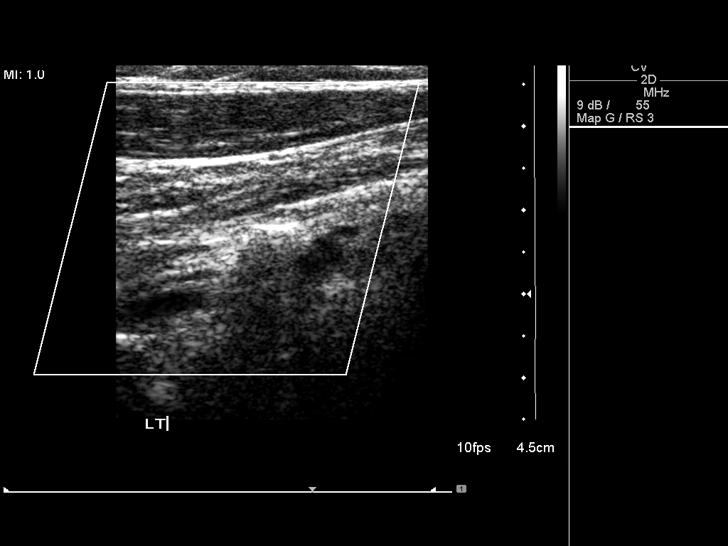

[13 of 24 positions shown; findings below may reference images not displayed]

FINDINGS: Criteria: Quantification of carotid stenosis is based on velocity
parameters that correlate the residual internal carotid diameter
with NASCET-based stenosis levels, using the diameter of the distal
internal carotid lumen as the denominator for stenosis measurement.

The following velocity measurements were obtained:

RIGHT

ICA:  103/33 cm/sec

CCA:  92/22 cm/sec

SYSTOLIC ICA/CCA RATIO:

DIASTOLIC ICA/CCA RATIO:

ECA:  101/11 cm/sec

LEFT

ICA:  96/33 cm/sec

CCA:  96/22 cm/sec

SYSTOLIC ICA/CCA RATIO:

DIASTOLIC ICA/CCA RATIO:

ECA:  102/13 cm/sec

RIGHT CAROTID ARTERY: Mild eccentric smooth plaque formation is
noted in the right internal carotid artery consistent with less than
50% diameter stenosis based on ultrasound and Doppler criteria.

RIGHT VERTEBRAL ARTERY:  Antegrade flow is noted.

LEFT CAROTID ARTERY: Minimal plaque formation is noted in the left
internal carotid artery consistent with less than 50% diameter
stenosis based on ultrasound and Doppler criteria.

LEFT VERTEBRAL ARTERY:  Antegrade flow is noted.
IMPRESSION: Mild eccentric smooth plaque formation is noted in right internal
carotid artery consistent with less than 50% diameter stenosis based
on ultrasound and Doppler criteria.

Minimal plaque formation is noted in the left internal carotid
artery consistent with less than 50% diameter stenosis based on
ultrasound and Doppler criteria.

## 2016-07-31 DIAGNOSIS — R7303 Prediabetes: Secondary | ICD-10-CM | POA: Diagnosis not present

## 2016-07-31 DIAGNOSIS — E785 Hyperlipidemia, unspecified: Secondary | ICD-10-CM | POA: Diagnosis not present

## 2016-07-31 DIAGNOSIS — I10 Essential (primary) hypertension: Secondary | ICD-10-CM | POA: Diagnosis not present

## 2016-07-31 DIAGNOSIS — Z23 Encounter for immunization: Secondary | ICD-10-CM | POA: Diagnosis not present

## 2016-07-31 DIAGNOSIS — Z0000 Encounter for general adult medical examination without abnormal findings: Secondary | ICD-10-CM | POA: Diagnosis not present

## 2016-07-31 DIAGNOSIS — Z1211 Encounter for screening for malignant neoplasm of colon: Secondary | ICD-10-CM | POA: Diagnosis not present

## 2016-08-07 DIAGNOSIS — Z1211 Encounter for screening for malignant neoplasm of colon: Secondary | ICD-10-CM | POA: Diagnosis not present

## 2016-09-15 DIAGNOSIS — S40012A Contusion of left shoulder, initial encounter: Secondary | ICD-10-CM | POA: Diagnosis not present

## 2016-09-15 DIAGNOSIS — M25512 Pain in left shoulder: Secondary | ICD-10-CM | POA: Diagnosis not present

## 2016-09-24 DIAGNOSIS — M25512 Pain in left shoulder: Secondary | ICD-10-CM | POA: Diagnosis not present

## 2016-10-19 DIAGNOSIS — M7542 Impingement syndrome of left shoulder: Secondary | ICD-10-CM | POA: Diagnosis not present

## 2016-11-09 ENCOUNTER — Encounter (HOSPITAL_COMMUNITY): Payer: Self-pay | Admitting: *Deleted

## 2016-11-09 ENCOUNTER — Emergency Department (HOSPITAL_COMMUNITY): Payer: BLUE CROSS/BLUE SHIELD

## 2016-11-09 ENCOUNTER — Emergency Department (HOSPITAL_COMMUNITY)
Admission: EM | Admit: 2016-11-09 | Discharge: 2016-11-09 | Disposition: A | Discharge status: Home / Self Care | Payer: BLUE CROSS/BLUE SHIELD | Attending: Emergency Medicine | Admitting: Emergency Medicine

## 2016-11-09 DIAGNOSIS — Z86011 Personal history of benign neoplasm of the brain: Secondary | ICD-10-CM | POA: Insufficient documentation

## 2016-11-09 DIAGNOSIS — S0990XA Unspecified injury of head, initial encounter: Secondary | ICD-10-CM | POA: Diagnosis not present

## 2016-11-09 DIAGNOSIS — Z7982 Long term (current) use of aspirin: Secondary | ICD-10-CM | POA: Diagnosis not present

## 2016-11-09 DIAGNOSIS — J101 Influenza due to other identified influenza virus with other respiratory manifestations: Secondary | ICD-10-CM

## 2016-11-09 DIAGNOSIS — I10 Essential (primary) hypertension: Secondary | ICD-10-CM | POA: Diagnosis not present

## 2016-11-09 DIAGNOSIS — Y9324 Activity, cross country skiing: Secondary | ICD-10-CM | POA: Insufficient documentation

## 2016-11-09 DIAGNOSIS — R079 Chest pain, unspecified: Secondary | ICD-10-CM | POA: Diagnosis not present

## 2016-11-09 DIAGNOSIS — Y929 Unspecified place or not applicable: Secondary | ICD-10-CM | POA: Diagnosis not present

## 2016-11-09 DIAGNOSIS — J09X2 Influenza due to identified novel influenza A virus with other respiratory manifestations: Secondary | ICD-10-CM | POA: Diagnosis not present

## 2016-11-09 DIAGNOSIS — Y999 Unspecified external cause status: Secondary | ICD-10-CM | POA: Diagnosis not present

## 2016-11-09 DIAGNOSIS — R51 Headache: Secondary | ICD-10-CM | POA: Diagnosis not present

## 2016-11-09 HISTORY — DX: Nonrheumatic mitral (valve) prolapse: I34.1

## 2016-11-09 LAB — COMPREHENSIVE METABOLIC PANEL
ALK PHOS: 65 U/L (ref 38–126)
ALT: 74 U/L — ABNORMAL HIGH (ref 14–54)
AST: 67 U/L — ABNORMAL HIGH (ref 15–41)
Albumin: 4.2 g/dL (ref 3.5–5.0)
Anion gap: 8 (ref 5–15)
BILIRUBIN TOTAL: 1.2 mg/dL (ref 0.3–1.2)
BUN: 7 mg/dL (ref 6–20)
CALCIUM: 9.1 mg/dL (ref 8.9–10.3)
CO2: 27 mmol/L (ref 22–32)
Chloride: 105 mmol/L (ref 101–111)
Creatinine, Ser: 0.79 mg/dL (ref 0.44–1.00)
GFR calc Af Amer: >60 mL/min (ref >60)
GFR calc non Af Amer: >60 mL/min (ref >60)
GLUCOSE: 119 mg/dL — AB (ref 65–99)
Potassium: 4 mmol/L (ref 3.5–5.1)
Sodium: 140 mmol/L (ref 135–145)
Total Protein: 6.6 g/dL (ref 6.5–8.1)

## 2016-11-09 LAB — CBC WITH DIFFERENTIAL/PLATELET
BASOS ABS: 0 10*3/uL (ref 0.0–0.1)
Basophils Relative: 0 %
EOS PCT: 3 %
Eosinophils Absolute: 0.1 10*3/uL (ref 0.0–0.7)
HCT: 43.1 % (ref 36.0–46.0)
Hemoglobin: 14.8 g/dL (ref 12.0–15.0)
Lymphocytes Relative: 10 %
Lymphs Abs: 0.5 10*3/uL — ABNORMAL LOW (ref 0.7–4.0)
MCH: 32.5 pg (ref 26.0–34.0)
MCHC: 34.3 g/dL (ref 30.0–36.0)
MCV: 94.7 fL (ref 78.0–100.0)
MONO ABS: 0.3 10*3/uL (ref 0.1–1.0)
Monocytes Relative: 6 %
Neutro Abs: 3.6 10*3/uL (ref 1.7–7.7)
Neutrophils Relative %: 81 %
PLATELETS: 121 10*3/uL — AB (ref 150–400)
RBC: 4.55 MIL/uL (ref 3.87–5.11)
RDW: 12.7 % (ref 11.5–15.5)
WBC: 4.5 10*3/uL (ref 4.0–10.5)

## 2016-11-09 LAB — INFLUENZA PANEL BY PCR (TYPE A & B)
INFLBPCR: NEGATIVE
Influenza A By PCR: POSITIVE — AB

## 2016-11-09 LAB — I-STAT CG4 LACTIC ACID, ED: Lactic Acid, Venous: 1.27 mmol/L (ref 0.5–1.9)

## 2016-11-09 MED ORDER — ONDANSETRON 4 MG PO TBDP
ORAL_TABLET | ORAL | Status: AC
  Filled 2016-11-09: qty 1

## 2016-11-09 MED ORDER — ACETAMINOPHEN 325 MG PO TABS
650.0000 mg | ORAL_TABLET | Freq: Once | ORAL | Status: AC
  Administered 2016-11-09: 650 mg via ORAL
  Filled 2016-11-09: qty 2

## 2016-11-09 MED ORDER — ONDANSETRON 4 MG PO TBDP
4.0000 mg | ORAL_TABLET | Freq: Once | ORAL | Status: AC
  Administered 2016-11-09: 4 mg via ORAL

## 2016-11-09 NOTE — ED Provider Notes (Signed)
[facility] DEPT Provider Note   CSN: [id number] Arrival date & time: 11/09/16  1108   By signing my name below, I, Delton Prairie, attest that this documentation has been prepared under the direction and in the presence of Daleen Bo, MD  Electronically Signed: Delton Prairie, ED Scribe. 11/09/16. 12:57 PM.   History   Chief Complaint Chief Complaint  Patient presents with  . Headache  . Fever   The history is provided by the patient. No language interpreter was used.   HPI Comments:  Joy Walsh is a 55 y.o. female, with a PMHx of HTN, meningioma, mitral valve prolapse, vertigo and migraines, who presents to the Emergency Department complaining of a persistent, moderate headache s/p a fall which occurred 2 days ago. Pt states she was skiing, landed on her back and hit her head. Pt notes she was wearing a helmet. She also reports neck pain, back pain, an intermittent non-productive cough, nausea, vomiting which began yesterday night, diarrhea with minimal blood which began 2 days ago and chills. Pt has a fever in the ED with a tmax of 102.6. She has taken 2-200 mg ibuprofen with no relief. Pt denies a gait problem, daily aspirin use, recent known sick contacts or any other associated symptoms. No other complaints noted.   Past Medical History:  Diagnosis Date  . Hypertension   . Meningioma ([place])   . Migraine   . Mitral valve prolapse     Patient Active Problem List   Diagnosis Date Noted  . Transient alteration of awareness 11/05/2015  . Migraine without aura and without status migrainosus, not intractable 11/05/2015  . Migraine 09/11/2014  . Benign neoplasm of cerebral meninges ([place]) 06/19/2013    Past Surgical History:  Procedure Laterality Date  . none      OB History    No data available       Home Medications    Prior to Admission medications   Medication Sig Start Date End Date Taking? Authorizing Provider  aspirin-acetaminophen-caffeine (EXCEDRIN  MIGRAINE) [phone] MG per tablet Take 1 tablet by mouth every 6 (six) hours as needed for pain.    Historical Provider, MD  bisoprolol-hydrochlorothiazide [facility]) 10-6.25 MG per tablet Take 1 tablet by mouth daily. 04/24/13   Historical Provider, MD  ibuprofen (ADVIL,MOTRIN) 200 MG tablet Take 200 mg by mouth every 6 (six) hours as needed for pain. Reported on 11/05/2015    Historical Provider, MD  omeprazole (PRILOSEC) 10 MG capsule Take 10 mg by mouth daily.    Historical Provider, MD    Family History Family History  Problem Relation Age of Onset  . Heart Problems Father     Social History Social History  Substance Use Topics  . Smoking status: Never Smoker  . Smokeless tobacco: Never Used  . Alcohol use 0.0 oz/week     Comment: Occ     Allergies   Amoxicillin-pot clavulanate and Sulfa antibiotics   Review of Systems Review of Systems  Constitutional: Positive for chills and fever.  Gastrointestinal: Positive for blood in stool, diarrhea, nausea and vomiting.  Musculoskeletal: Positive for myalgias and neck pain. Negative for gait problem.  Neurological: Positive for headaches.  All other systems reviewed and are negative.    Physical Exam Updated Vital Signs BP 116/63 (BP Location: Right Arm)   Pulse 91   Temp 99.1 F (37.3 C) (Oral)   Resp 18   Ht 5\' 5"  (1.651 m)   Wt 193 lb (87.5 kg)   LMP  10/15/2013   SpO2 94%   BMI 32.12 kg/m   Physical Exam  Constitutional: She is oriented to person, place, and time. She appears well-developed and well-nourished.  HENT:  Head: Normocephalic and atraumatic.  Right Ear: Tympanic membrane normal.  Left Ear: Tympanic membrane normal.  Nose: Nose normal.  Eyes: Conjunctivae and EOM are normal. Pupils are equal, round, and reactive to light.  Neck: Normal range of motion and phonation normal. Neck supple.  No meningismus    Cardiovascular: Normal rate and regular rhythm.   Pulmonary/Chest: Effort normal and breath sounds  normal. She exhibits no tenderness.  Non-productive cough   Abdominal: Soft. She exhibits no distension. There is no tenderness. There is no guarding.  Musculoskeletal: Normal range of motion.  Neurological: She is alert and oriented to person, place, and time. She exhibits normal muscle tone. She displays a negative Romberg sign. Gait normal.  Normal gait. Romberg negative   Skin: Skin is warm and dry.  Psychiatric: She has a normal mood and affect. Her behavior is normal. Judgment and thought content normal.  Nursing note and vitals reviewed.   ED Treatments / Results  DIAGNOSTIC STUDIES:  Oxygen Saturation is 98% on RA, normal by my interpretation.    COORDINATION OF CARE:  12:46 PM Discussed treatment plan with pt at bedside and pt agreed to plan.  Labs (all labs ordered are listed, but only abnormal results are displayed) Labs Reviewed  COMPREHENSIVE METABOLIC PANEL - Abnormal; Notable for the following:       Result Value   Glucose, Bld 119 (*)    AST 67 (*)    ALT 74 (*)    All other components within normal limits  CBC WITH DIFFERENTIAL/PLATELET - Abnormal; Notable for the following:    Platelets 121 (*)    Lymphs Abs 0.5 (*)    All other components within normal limits  INFLUENZA PANEL BY PCR (TYPE A & B) - Abnormal; Notable for the following:    Influenza A By PCR POSITIVE (*)    All other components within normal limits  I-STAT CG4 LACTIC ACID, ED    EKG  EKG Interpretation None       Radiology Dg Chest 2 View  Result Date: 11/09/2016 CLINICAL DATA:  56 year old female with chest pain, shortness of breath, headache, nausea and fever EXAM: CHEST  2 VIEW COMPARISON:  None. FINDINGS: The lungs are clear and negative for focal airspace consolidation, pulmonary edema or suspicious pulmonary nodule. No pleural effusion or pneumothorax. Cardiac and mediastinal contours are within normal limits. No acute fracture or lytic or blastic osseous lesions. The visualized  upper abdominal bowel gas pattern is unremarkable. IMPRESSION: Negative chest x-ray. Electronically Signed   By: Jacqulynn Cadet M.D.   On: 11/09/2016 12:33   Ct Head Wo Contrast  Result Date: 11/09/2016 CLINICAL DATA:  Pain following fall.  Nausea and vomiting EXAM: CT HEAD WITHOUT CONTRAST TECHNIQUE: Contiguous axial images were obtained from the base of the skull through the vertex without intravenous contrast. COMPARISON:  Brain MRI September 07, 2011 FINDINGS: Brain: The ventricles are normal in size and configuration. There is slight invagination of CSF into the sella. There is now calcification in the medial left frontal region with calcification tracking along the anterior falx at the level of the previously noted enhancing meningioma. There is no edema in the area of apparent calcified left frontal parafalcine meningioma. This calcification is causing modest chronic appearing mass effect on the medial left frontal lobe.  There is no other suggestion of mass. No hemorrhage, extra-axial fluid collection, or midline shift. Gray-white compartments appear normal. No evident acute infarct. Vascular: There is no hyperdense vessel. There is minimal calcification in the left carotid siphon region. Skull: The bony calvarium appears intact. Sinuses/Orbits: There is slight mucosal thickening in the lateral right maxillary antrum. There is slight mucosal thickening in several ethmoid air cells bilaterally. Other visualized paranasal sinuses are clear. Orbits appear symmetric bilaterally. Other: Mastoid air cells are clear. IMPRESSION: Calcification in the medial left frontal region extending into the anterior falx consistent with calcification of the previously noted enhancing meningioma. This calcification causes modest localized mass effect on the medial left frontal lobe, chronic and of doubtful clinical significance. There is no edema in this area. There is no other evidence suggesting mass. No hemorrhage,  extra-axial fluid collection, or midline shift. Gray-white compartments are normal. Minimal vascular calcification noted. Mild paranasal sinus disease. Electronically Signed   By: Lowella Grip III M.D.   On: 11/09/2016 14:22    Procedures Procedures (including critical care time)  Medications Ordered in ED Medications  ondansetron (ZOFRAN-ODT) disintegrating tablet 4 mg (4 mg Oral Given 11/09/16 1201)  acetaminophen (TYLENOL) tablet 650 mg (650 mg Oral Given 11/09/16 1311)     Initial Impression / Assessment and Plan / ED Course  I have reviewed the triage vital signs and the nursing notes.  Pertinent labs & imaging results that were available during my care of the patient were reviewed by me and considered in my medical decision making (see chart for details).      Medications  ondansetron (ZOFRAN-ODT) disintegrating tablet 4 mg (4 mg Oral Given 11/09/16 1201)  acetaminophen (TYLENOL) tablet 650 mg (650 mg Oral Given 11/09/16 1311)    Patient Vitals for the past 24 hrs:  BP Temp Temp src Pulse Resp SpO2 Height Weight  11/09/16 1602 116/63 99.1 F (37.3 C) Oral 91 18 94 % - -  11/09/16 1425 - 100.2 F (37.9 C) Oral - - - - -  11/09/16 1154 - - - - - - 5\' 5"  (1.651 m) 193 lb (87.5 kg)  11/09/16 1148 162/80 102.6 F (39.2 C) Oral 87 17 98 % 5\' 5"  (1.651 m) 193 lb (87.5 kg)    At discharge- reevaluation with update and discussion. After initial assessment and treatment, an updated evaluation reveals she remains comfortable has no further complaints, findings discussed with patient and all questions answered. Kenyada Hy L    Final Clinical Impressions(s) / ED Diagnoses   Final diagnoses:  Influenza A  Injury of head, initial encounter    On associated head injury, and influenza A.  Doubt serious intracranial, or spine injury.  Influenza a with low risk profile.  No indication for hospitalization or further treatment at this time.  Stable for outpatient management, with  OTC treatment.  Nursing Notes Reviewed/ Care Coordinated Applicable Imaging Reviewed Interpretation of Laboratory Data incorporated into ED treatment  The patient appears reasonably screened and/or stabilized for discharge and I doubt any other medical condition or other [facility] requiring further screening, evaluation, or treatment in the ED at this time prior to discharge.  Plan: Home Medications-APAP, for fever; Home Treatments-rest, fluids; return here if the recommended treatment, does not improve the symptoms; Recommended follow up-return, as needed   New Prescriptions Discharge Medication List as of 11/09/2016  3:56 PM    I personally performed the services described in this documentation, which was scribed in my presence. The recorded  information has been reviewed and is accurate     Daleen Bo, MD 11/09/16 2109

## 2016-11-09 NOTE — Discharge Instructions (Signed)
Get plenty of rest, and drink a lot of fluids.  Use Tylenol every 4 hours for fever or pain.  Use Robitussin-DM, for cough.  Relative to the head injury, there were no serious findings.  People at [place] of Developing Flu-Related Complications Most people who get the flu will have mild illness, will not need medical care or antiviral drugs, and will recover in less than two weeks. Some people, however, are more likely to get flu complications that result in being hospitalized and occasionally result in death. Pneumonia, bronchitis, sinus infections and ear infections are examples of flu-related complications. The flu also can make chronic health problems worse. For example, people with asthma may experience asthma attacks while they have the flu, and people with chronic congestive heart failure may experience a worsening of this condition that is triggered by the flu. The list below includes the groups of people more likely to get flu-related complications if they get sick from influenza.  People at [place] for Developing Flu-Related Complications Children younger than 5, but especially children younger than 51 years old Adults 45 years of age and older Pregnant women Also, American Indians and [place] Natives  seem to be at higher risk of flu complications People who have medical conditions including: Asthma Neurological and neurodevelopmental conditions [including disorders of the brain, spinal cord, peripheral nerve, and muscle such as cerebral palsy, epilepsy (seizure disorders), stroke, intellectual disability (mental retardation), moderate to severe developmental delay, muscular dystrophy, or spinal cord injury]. Chronic lung disease (such as chronic obstructive pulmonary disease [COPD] and cystic fibrosis) Heart disease (such as congenital heart disease, congestive heart failure and coronary artery disease) Blood disorders (such as sickle cell disease) Endocrine disorders (such as  diabetes mellitus) Kidney disorders Liver disorders Metabolic disorders (such as inherited metabolic disorders and mitochondrial disorders) Weakened immune system due to disease or medication (such as people with HIV or AIDS, or cancer, or those on chronic steroids) People younger than 55 years of age who are receiving long-term aspirin therapy People who are morbidly obese (Body Mass Index, or BMI, of 40 or greater) Note: There is no recommendation for pregnant women or people with pre-existing medical conditions to seek special permission or secure written consent from their doctor for influenza vaccination if they get vaccinated at a worksite clinic, pharmacy or other location outside of their physician?s office.

## 2016-11-09 NOTE — ED Notes (Signed)
Pt transported to and from CT scan on stretcher with tech, tolerated well.

## 2016-11-09 NOTE — ED Triage Notes (Signed)
Pt states she fell backwards while skiing, landing on her back and then hitting the back of her head.  She immediately began experiencing L frontal pain.  Denies blurred vision.  C/o neck pain, and nausea.  Also has diarrhea and temp of 102.6 in triage.  Has attempted to take tylenol, but vomits it up.

## 2016-11-24 DIAGNOSIS — J209 Acute bronchitis, unspecified: Secondary | ICD-10-CM | POA: Diagnosis not present

## 2016-11-24 DIAGNOSIS — J329 Chronic sinusitis, unspecified: Secondary | ICD-10-CM | POA: Diagnosis not present

## 2016-12-30 DIAGNOSIS — R55 Syncope and collapse: Secondary | ICD-10-CM | POA: Diagnosis not present

## 2016-12-30 DIAGNOSIS — R202 Paresthesia of skin: Secondary | ICD-10-CM | POA: Diagnosis not present

## 2016-12-30 DIAGNOSIS — I10 Essential (primary) hypertension: Secondary | ICD-10-CM | POA: Diagnosis not present

## 2016-12-30 DIAGNOSIS — R5383 Other fatigue: Secondary | ICD-10-CM | POA: Diagnosis not present

## 2016-12-30 DIAGNOSIS — R7303 Prediabetes: Secondary | ICD-10-CM | POA: Diagnosis not present

## 2017-01-01 DIAGNOSIS — R7303 Prediabetes: Secondary | ICD-10-CM | POA: Diagnosis not present

## 2017-01-04 ENCOUNTER — Telehealth: Payer: Self-pay | Admitting: Neurology

## 2017-01-04 NOTE — Telephone Encounter (Signed)
Patient of Dr. Krista Blue last seen 08/2014, pt has a new referral From Dr. Jason Nest ofc and they are requesting appointment w Dr. Jaynee Eagles. When I called the patient I told her she was already established with Dr. Krista Blue and we would need to sched w Dr. Krista Blue. Joy Walsh stated she wants to switch to Dr. Jaynee Eagles. Is it ok for pt to switch?

## 2017-01-04 NOTE — Telephone Encounter (Signed)
That's fine. thanks

## 2017-01-18 DIAGNOSIS — R1084 Generalized abdominal pain: Secondary | ICD-10-CM | POA: Diagnosis not present

## 2017-01-19 ENCOUNTER — Other Ambulatory Visit: Payer: Self-pay | Admitting: Family Medicine

## 2017-01-19 ENCOUNTER — Telehealth: Payer: Self-pay | Admitting: Neurology

## 2017-01-19 DIAGNOSIS — R1084 Generalized abdominal pain: Secondary | ICD-10-CM

## 2017-01-20 ENCOUNTER — Other Ambulatory Visit: Payer: BLUE CROSS/BLUE SHIELD

## 2017-01-20 ENCOUNTER — Ambulatory Visit
Admission: RE | Admit: 2017-01-20 | Discharge: 2017-01-20 | Disposition: A | Discharge status: Home / Self Care | Payer: BLUE CROSS/BLUE SHIELD | Source: Ambulatory Visit | Attending: Family Medicine | Admitting: Family Medicine

## 2017-01-20 DIAGNOSIS — R1084 Generalized abdominal pain: Secondary | ICD-10-CM

## 2017-01-20 DIAGNOSIS — K5732 Diverticulitis of large intestine without perforation or abscess without bleeding: Secondary | ICD-10-CM | POA: Diagnosis not present

## 2017-01-20 MED ORDER — IOPAMIDOL (ISOVUE-300) INJECTION 61%
100.0000 mL | Freq: Once | INTRAVENOUS | Status: AC | PRN
  Administered 2017-01-20: 100 mL via INTRAVENOUS

## 2017-01-20 NOTE — Telephone Encounter (Signed)
error 

## 2017-02-02 ENCOUNTER — Encounter: Payer: Self-pay | Admitting: Neurology

## 2017-02-02 ENCOUNTER — Telehealth: Payer: Self-pay | Admitting: Neurology

## 2017-02-02 NOTE — Telephone Encounter (Signed)
Patient of Dr. Krista Blue last seen 08/2014, pt has a new referral From Dr. Jason Nest ofc and they are requesting appointment w Dr. Jaynee Eagles. When I called the patient I told her she was already established with Dr. Krista Blue and we would need to sched w Dr. Krista Blue. Joy Walsh stated she wants to switch to Dr. Jaynee Eagles. Is it ok for pt to switch?

## 2017-02-02 NOTE — Telephone Encounter (Signed)
It is ok to change to Dr. Jaynee Eagles.

## 2017-02-02 NOTE — Telephone Encounter (Signed)
Error

## 2017-02-03 DIAGNOSIS — R1013 Epigastric pain: Secondary | ICD-10-CM | POA: Diagnosis not present

## 2017-02-03 DIAGNOSIS — R399 Unspecified symptoms and signs involving the genitourinary system: Secondary | ICD-10-CM | POA: Diagnosis not present

## 2017-02-04 ENCOUNTER — Other Ambulatory Visit (HOSPITAL_COMMUNITY): Payer: Self-pay | Admitting: Family Medicine

## 2017-02-04 DIAGNOSIS — R1011 Right upper quadrant pain: Secondary | ICD-10-CM

## 2017-02-04 DIAGNOSIS — R110 Nausea: Secondary | ICD-10-CM

## 2017-02-09 ENCOUNTER — Ambulatory Visit (HOSPITAL_COMMUNITY)
Admission: RE | Admit: 2017-02-09 | Discharge: 2017-02-09 | Disposition: A | Discharge status: Home / Self Care | Payer: BLUE CROSS/BLUE SHIELD | Source: Ambulatory Visit | Attending: Family Medicine | Admitting: Family Medicine

## 2017-02-09 DIAGNOSIS — R110 Nausea: Secondary | ICD-10-CM

## 2017-02-09 DIAGNOSIS — R1011 Right upper quadrant pain: Secondary | ICD-10-CM

## 2017-02-09 DIAGNOSIS — R109 Unspecified abdominal pain: Secondary | ICD-10-CM | POA: Diagnosis not present

## 2017-04-01 ENCOUNTER — Encounter: Payer: Self-pay | Admitting: Neurology

## 2017-04-01 ENCOUNTER — Ambulatory Visit (INDEPENDENT_AMBULATORY_CARE_PROVIDER_SITE_OTHER): Payer: BLUE CROSS/BLUE SHIELD | Admitting: Neurology

## 2017-04-01 VITALS — BP 133/84 | HR 66 | Ht 64.0 in | Wt 190.0 lb

## 2017-04-01 DIAGNOSIS — R42 Dizziness and giddiness: Secondary | ICD-10-CM | POA: Diagnosis not present

## 2017-04-01 DIAGNOSIS — D329 Benign neoplasm of meninges, unspecified: Secondary | ICD-10-CM | POA: Diagnosis not present

## 2017-04-01 DIAGNOSIS — G5603 Carpal tunnel syndrome, bilateral upper limbs: Secondary | ICD-10-CM | POA: Diagnosis not present

## 2017-04-01 DIAGNOSIS — H5462 Unqualified visual loss, left eye, normal vision right eye: Secondary | ICD-10-CM | POA: Diagnosis not present

## 2017-04-01 DIAGNOSIS — R200 Anesthesia of skin: Secondary | ICD-10-CM

## 2017-04-01 DIAGNOSIS — R208 Other disturbances of skin sensation: Secondary | ICD-10-CM

## 2017-04-01 DIAGNOSIS — R2689 Other abnormalities of gait and mobility: Secondary | ICD-10-CM | POA: Diagnosis not present

## 2017-04-01 NOTE — Progress Notes (Signed)
[facility]    Provider:  Dr Jaynee Eagles Referring Provider: Maurice Small, MD Primary Care Physician:  Maurice Small, MD  CC:  Tingling in the fingertips  HPI:  Joy Walsh is a 56 y.o. female here as a new referral from Dr. Justin Mend for several episodes of neurologic symptoms, possible multiple sclerosis, tingling in the fingers and evaluation for EMG nerve conduction study. 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 Patient was seen by [facility] neurosurgeon Dr. Domingo Cocking, not surgical candidate. She has numbness in her hands and feet and joint pain. She has had numbness in the hands for years. Worsening, especially the right hand, her hands become numb. She can't even grip her hairdryer, shaking the hands. She can be walking along and her feet go out from under her. She has occasional numbness and tingling, very infrequent. The left foot. She has had dizziness, balance issues, like vertigo and if she puts her hand on the wall she can reorient herself. She has neck pain and low back pain. Headaches are once a week, can last all day but that is uncommon. She takes excedrin and they can go away in 15-20 mnutes, takes excedrin minimally.  Reviewed notes, labs and imaging from outside physicians, which showed:   Labs redrawn May 2018, CBC with differential unremarkable, B12 486, folate 12.7 CMP showed BUN 19 and creatinine, glucose is elevated at 114 otherwise unremarkable, TSH normal  CT head 10/2016: Calcification in the medial left frontal region extending into the anterior falx consistent with calcification of the previously noted enhancing meningioma. This calcification causes modest localized mass effect on the medial left frontal lobe, chronic and of doubtful clinical significance. There is  no edema in this area. There is no other evidence suggesting mass. No hemorrhage, extra-axial fluid collection, or midline shift. Gray-white compartments are normal. Minimal vascular calcification noted. Mild paranasal sinus disease.  Review of Systems: Patient complains of symptoms per HPI as well as the following symptoms: blurred vision, memory loss, headache, numbness, sleepiness. Pertinent negatives and positives per HPI. All others negative.   Social History   Social History  . Marital status: Single    Spouse name: N/A  . Number of children: 5  . Years of education: college   Occupational History  . Real Estate     Self Employed   Social History Main Topics  . Smoking status: Never Smoker  . Smokeless tobacco: Never Used  . Alcohol use 0.0 oz/week     Comment: Occ  . Drug use: No  . Sexual activity: No   Other Topics Concern  . Not on file   Social History Narrative   Pt lives at home with her roommate. Pt has her masters's degree. Pt has five children. Pt denies any history of tobacco and illicit drugs. Pt drinks alcohol once a week.   Caffeine Use: 3-4 times weekly    Family History  Problem Relation Age of Onset  . Heart Problems Father     Past Medical History:  Diagnosis Date  . Hypertension   . Meningioma ([place])   . Migraine   . Mitral valve prolapse     Past Surgical History:  Procedure Laterality Date  . none      Current Outpatient Prescriptions  Medication Sig Dispense Refill  . aspirin-acetaminophen-caffeine (EXCEDRIN MIGRAINE) 250-250-65 MG per tablet Take  1 tablet by mouth every 6 (six) hours as needed for pain.    . bisoprolol-hydrochlorothiazide (ZIAC) 10-6.25 MG per tablet Take 1 tablet by mouth daily.    Marland Kitchen ibuprofen (ADVIL,MOTRIN) 200 MG tablet Take 200 mg by mouth every 6 (six) hours as needed for pain. Reported on 11/05/2015    . omeprazole (PRILOSEC) 10 MG capsule Take 10 mg by mouth daily.     No current facility-administered  medications for this visit.    Facility-Administered Medications Ordered in Other Visits  Medication Dose Route Frequency Provider Last Rate Last Dose  . gadopentetate dimeglumine (MAGNEVIST) injection 18 mL  18 mL Intravenous Once PRN Marcial Pacas, MD        Allergies as of 04/01/2017 - Review Complete 04/01/2017  Allergen Reaction Noted  . Amoxicillin-pot clavulanate  10/10/2015  . Sulfa antibiotics Rash 05/29/2013    Vitals: BP 133/84   Pulse 66   Ht 5\' 4"  (1.626 m)   Wt 190 lb (86.2 kg)   LMP 10/15/2013   BMI 32.61 kg/m  Last Weight:  Wt Readings from Last 1 Encounters:  04/01/17 190 lb (86.2 kg)   Last Height:   Ht Readings from Last 1 Encounters:  04/01/17 5\' 4"  (1.626 m)    Physical exam: Exam: Gen: NAD, conversant, well nourised, obese, well groomed                     CV: RRR, no MRG. No Carotid Bruits. No peripheral edema, warm, nontender Eyes: Conjunctivae clear without exudates or hemorrhage  Neuro: Detailed Neurologic Exam  Speech:    Speech is normal; fluent and spontaneous with normal comprehension.  Cognition:    The patient is oriented to person, place, and time;     recent and remote memory intact;     language fluent;     normal attention, concentration,     fund of knowledge Cranial Nerves:    The pupils are equal, round, and reactive to light. The fundi are normal and spontaneous venous pulsations are present. Visual fields are full to finger confrontation. Extraocular movements are intact. Trigeminal sensation is intact and the muscles of mastication are normal. The face is symmetric. The palate elevates in the midline. Hearing intact. Voice is normal. Shoulder shrug is normal. The tongue has normal motion without fasciculations.   Coordination:    Normal finger to nose and heel to shin. Normal rapid alternating movements.   Gait:    Heel-toe and tandem gait are normal.   Motor Observation:    No asymmetry, no atrophy, and no involuntary  movements noted. Tone:    Normal muscle tone.    Posture:    Posture is normal. normal erect    Strength:    Strength is V/V in the upper and lower limbs.      Sensation: intact to LT     Reflex Exam:  DTR's:    Deep tendon reflexes in the upper and lower extremities are normal bilaterally.   Toes:    The toes are downgoing bilaterally.   Clonus:    Clonus is absent.      Assessment/Plan:  56 year old with migraines, meningioma and multiple neurologic symptoms  EMG/NCS of the bilateral uppers and left leg MRI brain w/wo contrast: for imbalance, vision changes, left foot numbness and hand numbness, meningioma to eval for meningioma growth, anything new such as MS to cause her symptoms Migraine: can discuss at further appt, for now continue excedrin  prn no more than 4x a month.   Orders Placed This Encounter  Procedures  . MR BRAIN W WO CONTRAST  . NCV with EMG(electromyography)    Sarina Ill, MD  [facility] [address] [place], [place] [address]  Phone [phone] Fax [phone]

## 2017-04-01 NOTE — Patient Instructions (Signed)
Remember to drink plenty of fluid, eat healthy meals and do not skip any meals. Try to eat protein with a every meal and eat a healthy snack such as fruit or nuts in between meals. Try to keep a regular sleep-wake schedule and try to exercise daily, particularly in the form of walking, 20-30 minutes a day, if you can.   As far as diagnostic testing: emg/ncs, MRI brain  I would like to see you back for emg/ncs, sooner if we need to. Please call us with any interim questions, concerns, problems, updates or refill requests.   Our phone number is [phone]. We also have an after hours call service for urgent matters and there is a physician on-call for urgent questions. For any emergencies you know to call 911 or go to the nearest emergency room  To prevent or relieve headaches, try the following: Cool Compress. Lie down and place a cool compress on your head.  Avoid headache triggers. If certain foods or odors seem to have triggered your migraines in the past, avoid them. A headache diary might help you identify triggers.  Include physical activity in your daily routine. Try a daily walk or other moderate aerobic exercise.  Manage stress. Find healthy ways to cope with the stressors, such as delegating tasks on your to-do list.  Practice relaxation techniques. Try deep breathing, yoga, massage and visualization.  Eat regularly. Eating regularly scheduled meals and maintaining a healthy diet might help prevent headaches. Also, drink plenty of fluids.  Follow a regular sleep schedule. Sleep deprivation might contribute to headaches Consider biofeedback. With this mind-body technique, you learn to control certain bodily functions - such as muscle tension, heart rate and blood pressure - to prevent headaches or reduce headache pain.    Proceed to emergency room if you experience new or worsening symptoms or symptoms do not resolve, if you have new neurologic symptoms or if headache is severe, or for  any concerning symptom.   Provided education and documentation from American headache Society toolbox including articles on: chronic migraine medication overuse headache, chronic migraines, prevention of migraines, behavioral and other nonpharmacologic treatments for headache.   Migraine Headache A migraine headache is a very strong throbbing pain on one side or both sides of your head. Migraines can also cause other symptoms. Talk with your doctor about what things may bring on (trigger) your migraine headaches. Follow these instructions at home: Medicines  Take over-the-counter and prescription medicines only as told by your doctor.  Do not drive or use heavy machinery while taking prescription pain medicine.  To prevent or treat constipation while you are taking prescription pain medicine, your doctor may recommend that you: ? Drink enough fluid to keep your pee (urine) clear or pale yellow. ? Take over-the-counter or prescription medicines. ? Eat foods that are high in fiber. These include fresh fruits and vegetables, whole grains, and beans. ? Limit foods that are high in fat and processed sugars. These include fried and sweet foods. Lifestyle  Avoid alcohol.  Do not use any products that contain nicotine or tobacco, such as cigarettes and e-cigarettes. If you need help quitting, ask your doctor.  Get at least 8 hours of sleep every night.  Limit your stress. General instructions   Keep a journal to find out what may bring on your migraines. For example, write down: ? What you eat and drink. ? How much sleep you get. ? Any change in what you eat or drink. ? Any change  in your medicines.  If you have a migraine: ? Avoid things that make your symptoms worse, such as bright lights. ? It may help to lie down in a dark, quiet room. ? Do not drive or use heavy machinery. ? Ask your doctor what activities are safe for you.  Keep all follow-up visits as told by your doctor.  This is important. Contact a doctor if:  You get a migraine that is different or worse than your usual migraines. Get help right away if:  Your migraine gets very bad.  You have a fever.  You have a stiff neck.  You have trouble seeing.  Your muscles feel weak or like you cannot control them.  You start to lose your balance a lot.  You start to have trouble walking.  You pass out (faint). This information is not intended to replace advice given to you by your health care provider. Make sure you discuss any questions you have with your health care provider. Document Released: 05/26/2008 Document Revised: 03/06/2016 Document Reviewed: 02/03/2016 Elsevier Interactive Patient Education  2017 [employer].

## 2017-04-28 ENCOUNTER — Ambulatory Visit (INDEPENDENT_AMBULATORY_CARE_PROVIDER_SITE_OTHER): Payer: BLUE CROSS/BLUE SHIELD

## 2017-04-28 DIAGNOSIS — R2689 Other abnormalities of gait and mobility: Secondary | ICD-10-CM

## 2017-04-28 DIAGNOSIS — R200 Anesthesia of skin: Secondary | ICD-10-CM

## 2017-04-28 DIAGNOSIS — R208 Other disturbances of skin sensation: Secondary | ICD-10-CM | POA: Diagnosis not present

## 2017-04-28 DIAGNOSIS — D329 Benign neoplasm of meninges, unspecified: Secondary | ICD-10-CM

## 2017-04-28 DIAGNOSIS — R42 Dizziness and giddiness: Secondary | ICD-10-CM | POA: Diagnosis not present

## 2017-04-28 DIAGNOSIS — H5462 Unqualified visual loss, left eye, normal vision right eye: Secondary | ICD-10-CM

## 2017-04-28 MED ORDER — GADOPENTETATE DIMEGLUMINE 469.01 MG/ML IV SOLN: 15.0000 mL | Freq: Once | INTRAVENOUS | Status: AC | PRN

## 2017-04-29 ENCOUNTER — Ambulatory Visit (INDEPENDENT_AMBULATORY_CARE_PROVIDER_SITE_OTHER): Payer: Self-pay | Admitting: Neurology

## 2017-04-29 ENCOUNTER — Ambulatory Visit (INDEPENDENT_AMBULATORY_CARE_PROVIDER_SITE_OTHER): Payer: BLUE CROSS/BLUE SHIELD | Admitting: Neurology

## 2017-04-29 DIAGNOSIS — G5601 Carpal tunnel syndrome, right upper limb: Secondary | ICD-10-CM

## 2017-04-29 DIAGNOSIS — G5600 Carpal tunnel syndrome, unspecified upper limb: Secondary | ICD-10-CM

## 2017-04-29 DIAGNOSIS — Z0289 Encounter for other administrative examinations: Secondary | ICD-10-CM

## 2017-04-29 DIAGNOSIS — R208 Other disturbances of skin sensation: Secondary | ICD-10-CM

## 2017-04-29 DIAGNOSIS — R200 Anesthesia of skin: Secondary | ICD-10-CM

## 2017-04-29 NOTE — Procedures (Signed)
Full Name: Joy Walsh Gender: Female MRN #: [id number] Date of Birth: 01-29-1961    Visit Date: 04/29/2017 09:56 Age: 56 Years 58 Months Old Examining Physician: Sarina Ill, MD  Referring Physician: Maurice Small, MD  History: Patient with tingling, numbness in the extremities  Summary:  The right median APB motor nerve showed delayed distal onset latency (5.9 ms, N<4.4). The right median orthodromic sensory nerve showed delayed distal peak latency (4.6 ms, N<3.4) and reduced amplitude (3 V, N>10).  The right median/ulnar (palm) comparison nerve showed prolonged distal peak latency (Median Palm, 3.9 ms, N<2.2) and abnormal peak latency difference (Median Palm-Ulnar Palm, 2.1 ms, N<0.4) with a relative median delay. The left median/ulnar (palm) comparison nerve showed prolonged distal peak latency (Median Palm, 2.5 ms, N<2.2) and abnormal peak latency difference (Median Palm-Ulnar Palm, 0.8 ms, N<0.4) with a relative median delay. All remaining nerves (as detailed in the tables below) were within normal limits. All muscles (as detailed in the tables below) were within normal limits.    Conclusion: There is electrophysiologic evidence of moderately-severe right carpal tunnel syndrome and mild left carpal tunnel syndrome. EMG nerve conduction study of the left lower extremity was normal.  Sarina Ill M.D.  [facility] [address] [place], [place] [address] Tel: [phone] Fax: [phone]        [facility]    Nerve / Sites Muscle Latency Ref. Amplitude Ref. Rel Amp Segments Distance Velocity Ref. Area    ms ms mV mV %  cm m/s m/s mVms  L Median - APB     Wrist APB 3.9 ?4.4 5.6 ?4.0 100 Wrist - APB 7   21.6     Upper arm APB 7.8  5.4  96.7 Upper arm - Wrist 20 52 ?49 23.0  R Median - APB     Wrist APB 5.9 ?4.4 5.9 ?4.0 100 Wrist - APB 7   25.6     Upper arm APB 9.6  4.7  79.1 Upper arm - Wrist 20 53 ?49 19.4  L Ulnar - ADM     Wrist ADM 2.3 ?3.3 8.5 ?6.0  100 Wrist - ADM 7   22.6     B.Elbow ADM 5.7  7.2  84.9 B.Elbow - Wrist 18 52 ?49 19.9     A.Elbow ADM 7.7  6.9  96.2 A.Elbow - B.Elbow 10 51 ?49 19.3         A.Elbow - Wrist      R Ulnar - ADM     Wrist ADM 2.4 ?3.3 7.1 ?6.0 100 Wrist - ADM 7   21.1     B.Elbow ADM 5.5  6.9  97.7 B.Elbow - Wrist 18 58 ?49 19.8     A.Elbow ADM 7.6  6.8  97.3 A.Elbow - B.Elbow 12 59 ?49 18.3         A.Elbow - Wrist      L Peroneal - EDB     Ankle EDB 4.8 ?6.5 4.4 ?2.0 100 Ankle - EDB 9   16.2     Fib head EDB 10.4  4.1  91.6 Fib head - Ankle 29 52 ?44 15.5     Pop fossa EDB 12.2  4.2  103 Pop fossa - Fib head 10 55 ?44 16.5         Pop fossa - Ankle      L Tibial - AH     Ankle AH 4.5 ?5.8 18.5 ?4.0 100 Ankle - AH  9   43.5     Pop fossa AH 12.7  13.6  73.5 Pop fossa - Ankle 38 46 ?41 35.6                 SNC    Nerve / Sites Rec. Site Peak Lat Ref.  Amp Ref. Segments Distance Peak Diff Ref.    ms ms V V  cm ms ms  L Sural - Ankle (Calf)     Calf Ankle 3.3 ?4.4 8 ?6 Calf - Ankle 14    L Superficial peroneal - Ankle     Lat leg Ankle 4.1 ?4.4 10 ?6 Lat leg - Ankle 14    L Median, Ulnar - Transcarpal comparison     Median Palm Wrist 2.5 ?2.2 36 ?35 Median Palm - Wrist 8       Ulnar Palm Wrist 1.7 ?2.2 17 ?12 Ulnar Palm - Wrist 8          Median Palm - Ulnar Palm  0.8 ?0.4  R Median, Ulnar - Transcarpal comparison     Median Palm Wrist 3.9 ?2.2 8 ?35 Median Palm - Wrist 8       Ulnar Palm Wrist 1.8 ?2.2 13 ?12 Ulnar Palm - Wrist 8          Median Palm - Ulnar Palm  2.1 ?0.4  L Median - Orthodromic (Dig II, Mid palm)     Dig II Wrist 3.2 ?3.4 21 ?10 Dig II - Wrist 13    R Median - Orthodromic (Dig II, Mid palm)     Dig II Wrist 4.6 ?3.4 3 ?10 Dig II - Wrist 13    L Ulnar - Orthodromic, (Dig V, Mid palm)     Dig V Wrist 2.3 ?3.1 6 ?5 Dig V - Wrist 11    R Ulnar - Orthodromic, (Dig V, Mid palm)     Dig V Wrist 2.3 ?3.1 6 ?5 Dig V - Wrist 61                       F  Wave    Nerve F Lat Ref.    ms ms  L Tibial - AH 49.0 ?56.0  L Ulnar - ADM 25.8 ?32.0  R Ulnar - ADM 25.1 ?32.0           EMG full       EMG Summary Table    Spontaneous MUAP Recruitment  Muscle IA Fib PSW Fasc Other Amp Dur. Poly Pattern  Bilat. Deltoid Normal None None None _______ Normal Normal Normal Normal  Bilat. Triceps brachii Normal None None None _______ Normal Normal Normal Normal  Bilat. Pronator teres Normal None None None _______ Normal Normal Normal Normal  Bilat. First dorsal interosseous Normal None None None _______ Normal Normal Normal Normal  Bilat. Opponens pollicis Normal None None None _______ Normal Normal Normal Normal  Bilat. Cervical paraspinals (low) Normal None None None _______ Normal Normal Normal Normal

## 2017-04-29 NOTE — Addendum Note (Signed)
Addended by: Sarina Ill B on: 04/29/2017 07:34 PM   Modules accepted: Orders

## 2017-04-29 NOTE — Progress Notes (Signed)
See procedure note.

## 2017-04-29 NOTE — Progress Notes (Signed)
Full Name: Joy Walsh Gender: Female MRN #: [id number] Date of Birth: 11-Mar-1961    Visit Date: 04/29/2017 09:56 Age: 56 Years 66 Months Old Examining Physician: Sarina Ill, MD  Referring Physician: Maurice Small, MD  History: Patient with tingling, numbness in the extremities  Summary:  The right median APB motor nerve showed delayed distal onset latency (5.9 ms, N<4.4). The right median orthodromic sensory nerve showed delayed distal peak latency (4.6 ms, N<3.4) and reduced amplitude (3 V, N>10).  The right median/ulnar (palm) comparison nerve showed prolonged distal peak latency (Median Palm, 3.9 ms, N<2.2) and abnormal peak latency difference (Median Palm-Ulnar Palm, 2.1 ms, N<0.4) with a relative median delay. The left median/ulnar (palm) comparison nerve showed prolonged distal peak latency (Median Palm, 2.5 ms, N<2.2) and abnormal peak latency difference (Median Palm-Ulnar Palm, 0.8 ms, N<0.4) with a relative median delay. All remaining nerves (as detailed in the tables below) were within normal limits. All muscles (as detailed in the tables below) were within normal limits.    Conclusion: There is electrophysiologic evidence of moderately-severe right carpal tunnel syndrome and mild left carpal tunnel syndrome. EMG nerve conduction study of the left lower extremity was normal.  Sarina Ill M.D.  [facility] [place], [place] [address] Tel: [phone] Fax: [phone]        [facility]    Nerve / Sites Muscle Latency Ref. Amplitude Ref. Rel Amp Segments Distance Velocity Ref. Area    ms ms mV mV %  cm m/s m/s mVms  L Median - APB     Wrist APB 3.9 ?4.4 5.6 ?4.0 100 Wrist - APB 7   21.6     Upper arm APB 7.8  5.4  96.7 Upper arm - Wrist 20 52 ?49 23.0  R Median - APB     Wrist APB 5.9 ?4.4 5.9 ?4.0 100 Wrist - APB 7   25.6     Upper arm APB 9.6  4.7  79.1 Upper arm - Wrist 20 53 ?49 19.4  L Ulnar - ADM     Wrist ADM 2.3 ?3.3 8.5 ?6.0  100 Wrist - ADM 7   22.6     B.Elbow ADM 5.7  7.2  84.9 B.Elbow - Wrist 18 52 ?49 19.9     A.Elbow ADM 7.7  6.9  96.2 A.Elbow - B.Elbow 10 51 ?49 19.3         A.Elbow - Wrist      R Ulnar - ADM     Wrist ADM 2.4 ?3.3 7.1 ?6.0 100 Wrist - ADM 7   21.1     B.Elbow ADM 5.5  6.9  97.7 B.Elbow - Wrist 18 58 ?49 19.8     A.Elbow ADM 7.6  6.8  97.3 A.Elbow - B.Elbow 12 59 ?49 18.3         A.Elbow - Wrist      L Peroneal - EDB     Ankle EDB 4.8 ?6.5 4.4 ?2.0 100 Ankle - EDB 9   16.2     Fib head EDB 10.4  4.1  91.6 Fib head - Ankle 29 52 ?44 15.5     Pop fossa EDB 12.2  4.2  103 Pop fossa - Fib head 10 55 ?44 16.5         Pop fossa - Ankle      L Tibial - AH     Ankle AH 4.5 ?5.8 18.5 ?4.0 100 Ankle - AH  9   43.5     Pop fossa AH 12.7  13.6  73.5 Pop fossa - Ankle 38 46 ?41 35.6                 SNC    Nerve / Sites Rec. Site Peak Lat Ref.  Amp Ref. Segments Distance Peak Diff Ref.    ms ms V V  cm ms ms  L Sural - Ankle (Calf)     Calf Ankle 3.3 ?4.4 8 ?6 Calf - Ankle 14    L Superficial peroneal - Ankle     Lat leg Ankle 4.1 ?4.4 10 ?6 Lat leg - Ankle 14    L Median, Ulnar - Transcarpal comparison     Median Palm Wrist 2.5 ?2.2 36 ?35 Median Palm - Wrist 8       Ulnar Palm Wrist 1.7 ?2.2 17 ?12 Ulnar Palm - Wrist 8          Median Palm - Ulnar Palm  0.8 ?0.4  R Median, Ulnar - Transcarpal comparison     Median Palm Wrist 3.9 ?2.2 8 ?35 Median Palm - Wrist 8       Ulnar Palm Wrist 1.8 ?2.2 13 ?12 Ulnar Palm - Wrist 8          Median Palm - Ulnar Palm  2.1 ?0.4  L Median - Orthodromic (Dig II, Mid palm)     Dig II Wrist 3.2 ?3.4 21 ?10 Dig II - Wrist 13    R Median - Orthodromic (Dig II, Mid palm)     Dig II Wrist 4.6 ?3.4 3 ?10 Dig II - Wrist 13    L Ulnar - Orthodromic, (Dig V, Mid palm)     Dig V Wrist 2.3 ?3.1 6 ?5 Dig V - Wrist 11    R Ulnar - Orthodromic, (Dig V, Mid palm)     Dig V Wrist 2.3 ?3.1 6 ?5 Dig V - Wrist 54                       F  Wave    Nerve F Lat Ref.    ms ms  L Tibial - AH 49.0 ?56.0  L Ulnar - ADM 25.8 ?32.0  R Ulnar - ADM 25.1 ?32.0           EMG full       EMG Summary Table    Spontaneous MUAP Recruitment  Muscle IA Fib PSW Fasc Other Amp Dur. Poly Pattern  Bilat. Deltoid Normal None None None _______ Normal Normal Normal Normal  Bilat. Triceps brachii Normal None None None _______ Normal Normal Normal Normal  Bilat. Pronator teres Normal None None None _______ Normal Normal Normal Normal  Bilat. First dorsal interosseous Normal None None None _______ Normal Normal Normal Normal  Bilat. Opponens pollicis Normal None None None _______ Normal Normal Normal Normal  Bilat. Cervical paraspinals (low) Normal None None None _______ Normal Normal Normal Normal

## 2017-04-30 ENCOUNTER — Telehealth: Payer: Self-pay | Admitting: *Deleted

## 2017-04-30 NOTE — Telephone Encounter (Signed)
Called and spoke with patient about MRI results per AA,MD note. She verbalized understanding.

## 2017-04-30 NOTE — Telephone Encounter (Signed)
-----   Message from Melvenia Beam, MD sent at 04/29/2017  8:24 PM EDT ----- Mri of the brain is unchanged from 09/18/2015, no progression of meningioma or benign pineal cyst. She should repeat in 2 years. thanks

## 2017-05-11 DIAGNOSIS — S91302A Unspecified open wound, left foot, initial encounter: Secondary | ICD-10-CM | POA: Diagnosis not present

## 2017-07-01 DIAGNOSIS — R197 Diarrhea, unspecified: Secondary | ICD-10-CM | POA: Diagnosis not present

## 2017-07-01 DIAGNOSIS — R52 Pain, unspecified: Secondary | ICD-10-CM | POA: Diagnosis not present

## 2017-07-01 DIAGNOSIS — J069 Acute upper respiratory infection, unspecified: Secondary | ICD-10-CM | POA: Diagnosis not present

## 2017-07-14 DIAGNOSIS — Z23 Encounter for immunization: Secondary | ICD-10-CM | POA: Diagnosis not present

## 2017-07-16 DIAGNOSIS — H5213 Myopia, bilateral: Secondary | ICD-10-CM | POA: Diagnosis not present

## 2017-07-16 DIAGNOSIS — H43811 Vitreous degeneration, right eye: Secondary | ICD-10-CM | POA: Diagnosis not present

## 2017-07-16 DIAGNOSIS — H524 Presbyopia: Secondary | ICD-10-CM | POA: Diagnosis not present

## 2017-08-07 DIAGNOSIS — M180 Bilateral primary osteoarthritis of first carpometacarpal joints: Secondary | ICD-10-CM | POA: Diagnosis not present

## 2017-08-07 DIAGNOSIS — G5603 Carpal tunnel syndrome, bilateral upper limbs: Secondary | ICD-10-CM | POA: Diagnosis not present

## 2017-08-07 DIAGNOSIS — G5602 Carpal tunnel syndrome, left upper limb: Secondary | ICD-10-CM | POA: Diagnosis not present

## 2017-08-07 DIAGNOSIS — G5601 Carpal tunnel syndrome, right upper limb: Secondary | ICD-10-CM | POA: Diagnosis not present

## 2017-09-07 DIAGNOSIS — M62838 Other muscle spasm: Secondary | ICD-10-CM | POA: Diagnosis not present

## 2017-09-07 DIAGNOSIS — R5383 Other fatigue: Secondary | ICD-10-CM | POA: Diagnosis not present

## 2017-09-07 DIAGNOSIS — S29012A Strain of muscle and tendon of back wall of thorax, initial encounter: Secondary | ICD-10-CM | POA: Diagnosis not present

## 2017-09-28 DIAGNOSIS — G5601 Carpal tunnel syndrome, right upper limb: Secondary | ICD-10-CM | POA: Diagnosis not present

## 2017-11-15 ENCOUNTER — Other Ambulatory Visit: Payer: Self-pay | Admitting: Family Medicine

## 2017-11-15 DIAGNOSIS — N6320 Unspecified lump in the left breast, unspecified quadrant: Secondary | ICD-10-CM

## 2017-11-24 ENCOUNTER — Ambulatory Visit: Admission: RE | Admit: 2017-11-24 | Payer: BLUE CROSS/BLUE SHIELD | Source: Ambulatory Visit

## 2017-11-24 ENCOUNTER — Ambulatory Visit
Admission: RE | Admit: 2017-11-24 | Discharge: 2017-11-24 | Disposition: A | Discharge status: Home / Self Care | Payer: BLUE CROSS/BLUE SHIELD | Source: Ambulatory Visit | Attending: Family Medicine | Admitting: Family Medicine

## 2017-11-24 DIAGNOSIS — R928 Other abnormal and inconclusive findings on diagnostic imaging of breast: Secondary | ICD-10-CM | POA: Diagnosis not present

## 2017-11-24 DIAGNOSIS — N6320 Unspecified lump in the left breast, unspecified quadrant: Secondary | ICD-10-CM

## 2017-12-01 ENCOUNTER — Other Ambulatory Visit (HOSPITAL_COMMUNITY)
Admission: RE | Admit: 2017-12-01 | Discharge: 2017-12-01 | Disposition: A | Discharge status: Home / Self Care | Payer: BLUE CROSS/BLUE SHIELD | Source: Ambulatory Visit | Attending: Family Medicine | Admitting: Family Medicine

## 2017-12-01 ENCOUNTER — Other Ambulatory Visit: Payer: Self-pay | Admitting: Family Medicine

## 2017-12-01 DIAGNOSIS — I10 Essential (primary) hypertension: Secondary | ICD-10-CM | POA: Diagnosis not present

## 2017-12-01 DIAGNOSIS — Z124 Encounter for screening for malignant neoplasm of cervix: Secondary | ICD-10-CM | POA: Diagnosis not present

## 2017-12-01 DIAGNOSIS — Z01411 Encounter for gynecological examination (general) (routine) with abnormal findings: Secondary | ICD-10-CM | POA: Diagnosis not present

## 2017-12-01 DIAGNOSIS — E538 Deficiency of other specified B group vitamins: Secondary | ICD-10-CM | POA: Diagnosis not present

## 2017-12-01 DIAGNOSIS — Z0000 Encounter for general adult medical examination without abnormal findings: Secondary | ICD-10-CM | POA: Diagnosis not present

## 2017-12-01 DIAGNOSIS — R7303 Prediabetes: Secondary | ICD-10-CM | POA: Diagnosis not present

## 2017-12-01 DIAGNOSIS — E785 Hyperlipidemia, unspecified: Secondary | ICD-10-CM | POA: Diagnosis not present

## 2017-12-01 DIAGNOSIS — R5382 Chronic fatigue, unspecified: Secondary | ICD-10-CM | POA: Diagnosis not present

## 2017-12-03 LAB — CYTOLOGY - PAP
DIAGNOSIS: NEGATIVE
HPV (WINDOPATH): NOT DETECTED

## 2017-12-10 DIAGNOSIS — Z1211 Encounter for screening for malignant neoplasm of colon: Secondary | ICD-10-CM | POA: Diagnosis not present

## 2018-06-03 DIAGNOSIS — Z23 Encounter for immunization: Secondary | ICD-10-CM | POA: Diagnosis not present

## 2018-06-07 DIAGNOSIS — R079 Chest pain, unspecified: Secondary | ICD-10-CM | POA: Diagnosis not present

## 2018-06-07 DIAGNOSIS — I10 Essential (primary) hypertension: Secondary | ICD-10-CM | POA: Diagnosis not present

## 2018-06-07 DIAGNOSIS — R7303 Prediabetes: Secondary | ICD-10-CM | POA: Diagnosis not present

## 2018-06-07 DIAGNOSIS — E611 Iron deficiency: Secondary | ICD-10-CM | POA: Diagnosis not present

## 2018-06-07 DIAGNOSIS — E538 Deficiency of other specified B group vitamins: Secondary | ICD-10-CM | POA: Diagnosis not present

## 2018-06-07 DIAGNOSIS — R5382 Chronic fatigue, unspecified: Secondary | ICD-10-CM | POA: Diagnosis not present

## 2018-06-13 ENCOUNTER — Other Ambulatory Visit: Payer: Self-pay | Admitting: Family Medicine

## 2018-06-13 DIAGNOSIS — R51 Headache: Principal | ICD-10-CM

## 2018-06-13 DIAGNOSIS — R519 Headache, unspecified: Secondary | ICD-10-CM

## 2018-06-15 DIAGNOSIS — I10 Essential (primary) hypertension: Secondary | ICD-10-CM | POA: Diagnosis not present

## 2018-06-29 ENCOUNTER — Ambulatory Visit
Admission: RE | Admit: 2018-06-29 | Discharge: 2018-06-29 | Disposition: A | Discharge status: Home / Self Care | Payer: BLUE CROSS/BLUE SHIELD | Source: Ambulatory Visit | Attending: Family Medicine | Admitting: Family Medicine

## 2018-06-29 DIAGNOSIS — R51 Headache: Principal | ICD-10-CM

## 2018-06-29 DIAGNOSIS — D329 Benign neoplasm of meninges, unspecified: Secondary | ICD-10-CM | POA: Diagnosis not present

## 2018-06-29 DIAGNOSIS — R519 Headache, unspecified: Secondary | ICD-10-CM

## 2018-10-13 DIAGNOSIS — J0100 Acute maxillary sinusitis, unspecified: Secondary | ICD-10-CM | POA: Diagnosis not present

## 2019-07-10 DIAGNOSIS — Z20828 Contact with and (suspected) exposure to other viral communicable diseases: Secondary | ICD-10-CM | POA: Diagnosis not present

## 2019-07-10 DIAGNOSIS — R5383 Other fatigue: Secondary | ICD-10-CM | POA: Diagnosis not present

## 2019-08-27 DIAGNOSIS — R0981 Nasal congestion: Secondary | ICD-10-CM | POA: Diagnosis not present

## 2019-08-27 DIAGNOSIS — Z03818 Encounter for observation for suspected exposure to other biological agents ruled out: Secondary | ICD-10-CM | POA: Diagnosis not present

## 2019-11-07 DIAGNOSIS — Z23 Encounter for immunization: Secondary | ICD-10-CM | POA: Diagnosis not present

## 2019-11-28 DIAGNOSIS — Z23 Encounter for immunization: Secondary | ICD-10-CM | POA: Diagnosis not present

## 2019-12-28 DIAGNOSIS — H6591 Unspecified nonsuppurative otitis media, right ear: Secondary | ICD-10-CM | POA: Diagnosis not present

## 2020-01-03 ENCOUNTER — Other Ambulatory Visit: Payer: Self-pay | Admitting: Family Medicine

## 2020-01-03 DIAGNOSIS — Z1231 Encounter for screening mammogram for malignant neoplasm of breast: Secondary | ICD-10-CM

## 2020-01-11 DIAGNOSIS — H6983 Other specified disorders of Eustachian tube, bilateral: Secondary | ICD-10-CM | POA: Diagnosis not present

## 2020-01-11 DIAGNOSIS — M545 Low back pain: Secondary | ICD-10-CM | POA: Diagnosis not present

## 2020-01-11 DIAGNOSIS — K5792 Diverticulitis of intestine, part unspecified, without perforation or abscess without bleeding: Secondary | ICD-10-CM | POA: Diagnosis not present

## 2020-01-11 DIAGNOSIS — R030 Elevated blood-pressure reading, without diagnosis of hypertension: Secondary | ICD-10-CM | POA: Diagnosis not present

## 2020-01-16 DIAGNOSIS — R197 Diarrhea, unspecified: Secondary | ICD-10-CM | POA: Diagnosis not present

## 2020-01-16 DIAGNOSIS — R109 Unspecified abdominal pain: Secondary | ICD-10-CM | POA: Diagnosis not present

## 2020-01-16 DIAGNOSIS — R110 Nausea: Secondary | ICD-10-CM | POA: Diagnosis not present

## 2020-01-16 DIAGNOSIS — R102 Pelvic and perineal pain: Secondary | ICD-10-CM | POA: Diagnosis not present

## 2020-01-16 DIAGNOSIS — R7303 Prediabetes: Secondary | ICD-10-CM | POA: Diagnosis not present

## 2020-02-07 DIAGNOSIS — E785 Hyperlipidemia, unspecified: Secondary | ICD-10-CM | POA: Diagnosis not present

## 2020-02-07 DIAGNOSIS — E119 Type 2 diabetes mellitus without complications: Secondary | ICD-10-CM | POA: Diagnosis not present

## 2020-02-07 DIAGNOSIS — I10 Essential (primary) hypertension: Secondary | ICD-10-CM | POA: Diagnosis not present

## 2020-02-07 DIAGNOSIS — R7989 Other specified abnormal findings of blood chemistry: Secondary | ICD-10-CM | POA: Diagnosis not present

## 2020-04-09 DIAGNOSIS — E119 Type 2 diabetes mellitus without complications: Secondary | ICD-10-CM | POA: Diagnosis not present

## 2020-04-09 DIAGNOSIS — I10 Essential (primary) hypertension: Secondary | ICD-10-CM | POA: Diagnosis not present

## 2020-04-22 DIAGNOSIS — E785 Hyperlipidemia, unspecified: Secondary | ICD-10-CM | POA: Diagnosis not present

## 2020-04-22 DIAGNOSIS — Z0000 Encounter for general adult medical examination without abnormal findings: Secondary | ICD-10-CM | POA: Diagnosis not present

## 2020-04-22 DIAGNOSIS — I10 Essential (primary) hypertension: Secondary | ICD-10-CM | POA: Diagnosis not present

## 2020-04-22 DIAGNOSIS — R7303 Prediabetes: Secondary | ICD-10-CM | POA: Diagnosis not present

## 2020-04-24 DIAGNOSIS — Z1211 Encounter for screening for malignant neoplasm of colon: Secondary | ICD-10-CM | POA: Diagnosis not present

## 2020-05-31 DIAGNOSIS — H5203 Hypermetropia, bilateral: Secondary | ICD-10-CM | POA: Diagnosis not present

## 2020-05-31 DIAGNOSIS — E119 Type 2 diabetes mellitus without complications: Secondary | ICD-10-CM | POA: Diagnosis not present

## 2020-05-31 DIAGNOSIS — H52203 Unspecified astigmatism, bilateral: Secondary | ICD-10-CM | POA: Diagnosis not present

## 2020-06-26 DIAGNOSIS — M722 Plantar fascial fibromatosis: Secondary | ICD-10-CM | POA: Diagnosis not present

## 2021-01-15 DIAGNOSIS — D225 Melanocytic nevi of trunk: Secondary | ICD-10-CM | POA: Diagnosis not present

## 2021-01-15 DIAGNOSIS — Z1283 Encounter for screening for malignant neoplasm of skin: Secondary | ICD-10-CM | POA: Diagnosis not present

## 2021-01-15 DIAGNOSIS — L720 Epidermal cyst: Secondary | ICD-10-CM | POA: Diagnosis not present

## 2021-01-19 DIAGNOSIS — R197 Diarrhea, unspecified: Secondary | ICD-10-CM | POA: Diagnosis not present

## 2021-01-28 DIAGNOSIS — S91332A Puncture wound without foreign body, left foot, initial encounter: Secondary | ICD-10-CM | POA: Diagnosis not present

## 2021-04-30 DIAGNOSIS — Z0000 Encounter for general adult medical examination without abnormal findings: Secondary | ICD-10-CM | POA: Diagnosis not present

## 2021-04-30 DIAGNOSIS — E785 Hyperlipidemia, unspecified: Secondary | ICD-10-CM | POA: Diagnosis not present

## 2021-04-30 DIAGNOSIS — I10 Essential (primary) hypertension: Secondary | ICD-10-CM | POA: Diagnosis not present

## 2021-04-30 DIAGNOSIS — E1165 Type 2 diabetes mellitus with hyperglycemia: Secondary | ICD-10-CM | POA: Diagnosis not present

## 2021-05-27 DIAGNOSIS — S99921A Unspecified injury of right foot, initial encounter: Secondary | ICD-10-CM | POA: Diagnosis not present

## 2021-05-27 DIAGNOSIS — S92501A Displaced unspecified fracture of right lesser toe(s), initial encounter for closed fracture: Secondary | ICD-10-CM | POA: Diagnosis not present

## 2021-05-27 DIAGNOSIS — M79671 Pain in right foot: Secondary | ICD-10-CM | POA: Diagnosis not present

## 2021-05-27 DIAGNOSIS — M10072 Idiopathic gout, left ankle and foot: Secondary | ICD-10-CM | POA: Diagnosis not present

## 2021-06-03 DIAGNOSIS — H5203 Hypermetropia, bilateral: Secondary | ICD-10-CM | POA: Diagnosis not present

## 2021-06-03 DIAGNOSIS — E119 Type 2 diabetes mellitus without complications: Secondary | ICD-10-CM | POA: Diagnosis not present

## 2021-06-03 DIAGNOSIS — D140 Benign neoplasm of middle ear, nasal cavity and accessory sinuses: Secondary | ICD-10-CM | POA: Diagnosis not present

## 2021-07-16 DIAGNOSIS — K0381 Cracked tooth: Secondary | ICD-10-CM | POA: Diagnosis not present

## 2021-07-30 DIAGNOSIS — F4321 Adjustment disorder with depressed mood: Secondary | ICD-10-CM | POA: Diagnosis not present

## 2022-01-06 DIAGNOSIS — M791 Myalgia, unspecified site: Secondary | ICD-10-CM | POA: Diagnosis not present

## 2022-01-06 DIAGNOSIS — R5381 Other malaise: Secondary | ICD-10-CM | POA: Diagnosis not present

## 2022-04-22 DIAGNOSIS — R519 Headache, unspecified: Secondary | ICD-10-CM | POA: Diagnosis not present

## 2022-05-13 DIAGNOSIS — Z13220 Encounter for screening for lipoid disorders: Secondary | ICD-10-CM | POA: Diagnosis not present

## 2022-05-13 DIAGNOSIS — Z0000 Encounter for general adult medical examination without abnormal findings: Secondary | ICD-10-CM | POA: Diagnosis not present

## 2022-05-13 DIAGNOSIS — Z23 Encounter for immunization: Secondary | ICD-10-CM | POA: Diagnosis not present

## 2022-05-13 DIAGNOSIS — R7303 Prediabetes: Secondary | ICD-10-CM | POA: Diagnosis not present

## 2022-05-13 DIAGNOSIS — I10 Essential (primary) hypertension: Secondary | ICD-10-CM | POA: Diagnosis not present

## 2022-05-13 DIAGNOSIS — E785 Hyperlipidemia, unspecified: Secondary | ICD-10-CM | POA: Diagnosis not present

## 2022-05-15 ENCOUNTER — Other Ambulatory Visit (HOSPITAL_BASED_OUTPATIENT_CLINIC_OR_DEPARTMENT_OTHER): Payer: Self-pay | Admitting: Family Medicine

## 2022-05-15 DIAGNOSIS — Z1231 Encounter for screening mammogram for malignant neoplasm of breast: Secondary | ICD-10-CM

## 2022-06-29 ENCOUNTER — Encounter: Payer: Self-pay | Admitting: Neurology

## 2022-06-29 ENCOUNTER — Ambulatory Visit (INDEPENDENT_AMBULATORY_CARE_PROVIDER_SITE_OTHER): Payer: BC Managed Care – PPO | Admitting: Neurology

## 2022-06-29 VITALS — BP 117/68 | HR 61 | Ht 65.0 in | Wt 194.0 lb

## 2022-06-29 DIAGNOSIS — H539 Unspecified visual disturbance: Secondary | ICD-10-CM

## 2022-06-29 DIAGNOSIS — G43009 Migraine without aura, not intractable, without status migrainosus: Secondary | ICD-10-CM | POA: Diagnosis not present

## 2022-06-29 DIAGNOSIS — D320 Benign neoplasm of cerebral meninges: Secondary | ICD-10-CM | POA: Diagnosis not present

## 2022-06-29 DIAGNOSIS — G43109 Migraine with aura, not intractable, without status migrainosus: Secondary | ICD-10-CM

## 2022-06-29 DIAGNOSIS — R519 Headache, unspecified: Secondary | ICD-10-CM | POA: Diagnosis not present

## 2022-06-29 MED ORDER — UBRELVY 100 MG PO TABS: 100.0000 mg | ORAL_TABLET | ORAL | 0 refills | Status: AC | PRN

## 2022-06-29 MED ORDER — ONDANSETRON 4 MG PO TBDP: 4.0000 mg | ORAL_TABLET | Freq: Three times a day (TID) | ORAL | 3 refills | Status: AC | PRN

## 2022-06-29 MED ORDER — RIZATRIPTAN BENZOATE 5 MG PO TBDP: 5.0000 mg | ORAL_TABLET | ORAL | 11 refills | Status: AC | PRN

## 2022-06-29 NOTE — Patient Instructions (Addendum)
- Acute Migraine: Stop zomig and try Rizatriptan: Please take one tablet at the onset of your headache. If it does not improve the symptoms please take one additional tablet. Do not take more then 2 tablets in 24hrs. Do not take use more then 2 to 3 times in a week.IF this causes sedation next thing I would try is nurtec or ubrelvy. Can take with an ondansetron. -Try ubrogepant sample  There is increased risk for stroke in women with migraine with aura and a contraindication for the combined contraceptive pill for use by women who have migraine with aura. The risk for women with migraine without aura is lower. However other risk factors like smoking are far more likely to increase stroke risk than migraine. There is a recommendation for no smoking and for the use of OCPs without estrogen such as progestogen only pills particularly for women with migraine with aura.Joy Walsh People who have migraine headaches with auras may be 3 times more likely to have a stroke caused by a blood clot, compared to migraine patients who don't see auras. Women who take hormone-replacement therapy may be 30 percent more likely to suffer a clot-based stroke than women not taking medication containing estrogen. Other risk factors like smoking and high blood pressure may be  much more important.  Migraine Headache A migraine headache is an intense, throbbing pain on one side or both sides of the head. Migraine headaches may also cause other symptoms, such as nausea, vomiting, and sensitivity to light and noise. A migraine headache can last from 4 hours to 3 days. Talk with your doctor about what things may bring on (trigger) your migraine headaches. What are the causes? The exact cause of this condition is not known. However, a migraine may be caused when nerves in the brain become irritated and release chemicals that cause inflammation of blood vessels. This inflammation causes pain. This condition may be triggered or caused  by: Drinking alcohol. Smoking. Taking medicines, such as: Medicine used to treat chest pain (nitroglycerin). Birth control pills. Estrogen. Certain blood pressure medicines. Eating or drinking products that contain nitrates, glutamate, aspartame, or tyramine. Aged cheeses, chocolate, or caffeine may also be triggers. Doing physical activity. Other things that may trigger a migraine headache include: Menstruation. Pregnancy. Hunger. Stress. Lack of sleep or too much sleep. Weather changes. Fatigue. What increases the risk? The following factors may make you more likely to experience migraine headaches: Being a certain age. This condition is more common in people who are 44-34 years old. Being female. Having a family history of migraine headaches. Being Caucasian. Having a mental health condition, such as depression or anxiety. Being obese. What are the signs or symptoms? The main symptom of this condition is pulsating or throbbing pain. This pain may: Happen in any area of the head, such as on one side or both sides. Interfere with daily activities. Get worse with physical activity. Get worse with exposure to bright lights or loud noises. Other symptoms may include: Nausea. Vomiting. Dizziness. General sensitivity to bright lights, loud noises, or smells. Before you get a migraine headache, you may get warning signs (an aura). An aura may include: Seeing flashing lights or having blind spots. Seeing bright spots, halos, or zigzag lines. Having tunnel vision or blurred vision. Having numbness or a tingling feeling. Having trouble talking. Having muscle weakness. Some people have symptoms after a migraine headache (postdromal phase), such as: Feeling tired. Difficulty concentrating. How is this diagnosed? A migraine headache can be  diagnosed based on: Your symptoms. A physical exam. Tests, such as: CT scan or an MRI of the head. These imaging tests can help rule out  other causes of headaches. Taking fluid from the spine (lumbar puncture) and analyzing it (cerebrospinal fluid analysis, or CSF analysis). How is this treated? This condition may be treated with medicines that: Relieve pain. Relieve nausea. Prevent migraine headaches. Treatment for this condition may also include: Acupuncture. Lifestyle changes like avoiding foods that trigger migraine headaches. Biofeedback. Cognitive behavioral therapy. Follow these instructions at home: Medicines Take over-the-counter and prescription medicines only as told by your health care provider. Ask your health care provider if the medicine prescribed to you: Requires you to avoid driving or using heavy machinery. Can cause constipation. You may need to take these actions to prevent or treat constipation: Drink enough fluid to keep your urine pale yellow. Take over-the-counter or prescription medicines. Eat foods that are high in fiber, such as beans, whole grains, and fresh fruits and vegetables. Limit foods that are high in fat and processed sugars, such as fried or sweet foods. Lifestyle Do not drink alcohol. Do not use any products that contain nicotine or tobacco, such as cigarettes, e-cigarettes, and chewing tobacco. If you need help quitting, ask your health care provider. Get at least 8 hours of sleep every night. Find ways to manage stress, such as meditation, deep breathing, or yoga. General instructions Keep a journal to find out what may trigger your migraine headaches. For example, write down: What you eat and drink. How much sleep you get. Any change to your diet or medicines. If you have a migraine headache: Avoid things that make your symptoms worse, such as bright lights. It may help to lie down in a dark, quiet room. Do not drive or use heavy machinery. Ask your health care provider what activities are safe for you while you are experiencing symptoms. Keep all follow-up visits as told  by your health care provider. This is important. Contact a health care provider if: You develop symptoms that are different or more severe than your usual migraine headache symptoms. You have more than 15 headache days in one month. Get help right away if: Your migraine headache becomes severe. Your migraine headache lasts longer than 72 hours. You have a fever. You have a stiff neck. You have vision loss. Your muscles feel weak or like you cannot control them. You start to lose your balance often. You have trouble walking. You faint. You have a seizure. Summary A migraine headache is an intense, throbbing pain on one side or both sides of the head. Migraines may also cause other symptoms, such as nausea, vomiting, and sensitivity to light and noise. This condition may be treated with medicines and lifestyle changes. You may also need to avoid certain things that trigger a migraine headache. Keep a journal to find out what may trigger your migraine headaches. Contact your health care provider if you have more than 15 headache days in a month or you develop symptoms that are different or more severe than your usual migraine headache symptoms. This information is not intended to replace advice given to you by your health care provider. Make sure you discuss any questions you have with your health care provider. Document Revised: 01/29/2022 Document Reviewed: 09/29/2018 Elsevier Patient Education  [place].  Ondansetron Dissolving Tablets What is this medication? ONDANSETRON (on DAN se tron) prevents nausea and vomiting from chemotherapy, radiation, or surgery. It works by blocking  substances in the body that may cause nausea or vomiting. It belongs to a group of medications called antiemetics. This medicine may be used for other purposes; ask your health care provider or pharmacist if you have questions. COMMON BRAND NAME(S): Zofran ODT What should I tell my care team before I  take this medication? They need to know if you have any of these conditions: Heart disease History of irregular heartbeat Liver disease Low levels of magnesium or potassium in the blood An unusual or allergic reaction to ondansetron, granisetron, other medications, foods, dyes, or preservatives Pregnant or trying to get pregnant Breast-feeding How should I use this medication? These tablets are made to dissolve in the mouth. Do not try to push the tablet through the foil backing. With dry hands, peel away the foil backing and gently remove the tablet. Place the tablet in the mouth and allow it to dissolve, then swallow. While you may take these tablets with water, it is not necessary to do so. Talk to your care team regarding the use of this medication in children. Special care may be needed. Overdosage: If you think you have taken too much of this medicine contact a poison control center or emergency room at once. NOTE: This medicine is only for you. Do not share this medicine with others. What if I miss a dose? If you miss a dose, take it as soon as you can. If it is almost time for your next dose, take only that dose. Do not take double or extra doses. What may interact with this medication? Do not take this medication with any of the following: Apomorphine Certain medications for fungal infections like fluconazole, itraconazole, ketoconazole, posaconazole, voriconazole Cisapride Dronedarone Pimozide Thioridazine This medication may also interact with the following: Carbamazepine Certain medications for depression, anxiety, or psychotic disturbances Fentanyl Linezolid MAOIs like Carbex, Eldepryl, Marplan, Nardil, and Parnate Methylene blue (injected into a vein) Other medications that prolong the QT interval (cause an abnormal heart rhythm) like dofetilide, ziprasidone Phenytoin Rifampicin Tramadol This list may not describe all possible interactions. Give your health care  provider a list of all the medicines, herbs, non-prescription drugs, or dietary supplements you use. Also tell them if you smoke, drink alcohol, or use illegal drugs. Some items may interact with your medicine. What should I watch for while using this medication? Check with your care team as soon as you can if you have any sign of an allergic reaction. What side effects may I notice from receiving this medication? Side effects that you should report to your care team as soon as possible: Allergic reactions--skin rash, itching, hives, swelling of the face, lips, tongue, or throat Bowel blockage--stomach cramping, unable to have a bowel movement or pass gas, loss of appetite, vomiting Chest pain (angina)--pain, pressure, or tightness in the chest, neck, back, or arms Heart rhythm changes--fast or irregular heartbeat, dizziness, feeling faint or lightheaded, chest pain, trouble breathing Irritability, confusion, fast or irregular heartbeat, muscle stiffness, twitching muscles, sweating, high fever, seizure, chills, vomiting, diarrhea, which may be signs of serotonin syndrome Side effects that usually do not require medical attention (report to your care team if they continue or are bothersome): Constipation Diarrhea General discomfort and fatigue Headache This list may not describe all possible side effects. Call your doctor for medical advice about side effects. You may report side effects to FDA at 1-800-FDA-1088. Where should I keep my medication? Keep out of the reach of children and pets. Store between 2 and  30 degrees C (36 and 86 degrees F). Throw away any unused medication after the expiration date. NOTE: This sheet is a summary. It may not cover all possible information. If you have questions about this medicine, talk to your doctor, pharmacist, or health care provider.  2023 Elsevier/Gold Standard (2007-10-08 00:00:00) Rizatriptan Disintegrating Tablets What is this  medication? RIZATRIPTAN (rye za TRIP tan) treats migraines. It works by blocking pain signals and narrowing blood vessels in the brain. It belongs to a group of medications called triptans. It is not used to prevent migraines. This medicine may be used for other purposes; ask your health care provider or pharmacist if you have questions. COMMON BRAND NAME(S): Maxalt-MLT What should I tell my care team before I take this medication? They need to know if you have any of these conditions: Circulation problems in fingers and toes Diabetes Heart disease High blood pressure High cholesterol History of irregular heartbeat History of stroke Stomach or intestine problems Tobacco use An unusual or allergic reaction to rizatriptan, other medications, foods, dyes, or preservatives Pregnant or trying to get pregnant Breast-feeding How should I use this medication? Take this medication by mouth. Take it as directed on the prescription label. You do not need water to take this medication. Leave the tablet in the sealed pack until you are ready to take it. With dry hands, open the pack and gently remove the tablet. If the tablet breaks or crumbles, throw it away. Use a new tablet. Place the tablet on the tongue and allow it to dissolve. Then, swallow it. Do not cut, crush, or chew this medication. Do not use it more often than directed. Talk to your care team about the use of this medication in children. While it may be prescribed for children as young as 6 years for selected conditions, precautions do apply. Overdosage: If you think you have taken too much of this medicine contact a poison control center or emergency room at once. NOTE: This medicine is only for you. Do not share this medicine with others. What if I miss a dose? This does not apply. This medication is not for regular use. What may interact with this medication? Do not take this medication with any of the following: Ergot alkaloids, such  as dihydroergotamine, ergotamine MAOIs, such as Marplan, Nardil, Parnate Other medications for migraine headache, such as almotriptan, eletriptan, frovatriptan, naratriptan, sumatriptan, zolmitriptan This medication may also interact with the following: Certain medications for depression, anxiety, or other mental health conditions Propranolol This list may not describe all possible interactions. Give your health care provider a list of all the medicines, herbs, non-prescription drugs, or dietary supplements you use. Also tell them if you smoke, drink alcohol, or use illegal drugs. Some items may interact with your medicine. What should I watch for while using this medication? Visit your care team for regular checks on your progress. Tell your care team if your symptoms do not start to get better or if they get worse. This medication may affect your coordination, reaction time, or judgment. Do not drive or operate machinery until you know how this medication affects you. Sit up or stand slowly to reduce the risk of dizzy or fainting spells. If you take migraine medications for 10 or more days a month, your migraines may get worse. Keep a diary of headache days and medication use. Contact your care team if your migraine attacks occur more frequently. What side effects may I notice from receiving this medication? Side effects  that you should report to your care team as soon as possible: Allergic reactions--skin rash, itching, hives, swelling of the face, lips, tongue, or throat Burning, pain, tingling, or color changes in the hands, arms, legs, or feet Heart attack--pain or tightness in the chest, shoulders, arms, or jaw, nausea, shortness of breath, cold or clammy skin, feeling faint or lightheaded Heart rhythm changes--fast or irregular heartbeat, dizziness, feeling faint or lightheaded, chest pain, trouble breathing Increase in blood pressure Irritability, confusion, fast or irregular heartbeat,  muscle stiffness, twitching muscles, sweating, high fever, seizure, chills, vomiting, diarrhea, which may be signs of serotonin syndrome Raynaud syndrome--cool, numb, or painful fingers or toes that may change color from pale, to blue, to red Seizures Stroke--sudden numbness or weakness of the face, arm, or leg, trouble speaking, confusion, trouble walking, loss of balance or coordination, dizziness, severe headache, change in vision Sudden or severe stomach pain, bloody diarrhea, fever, nausea, vomiting Vision loss Side effects that usually do not require medical attention (report to your care team if they continue or are bothersome): Dizziness Unusual weakness or fatigue This list may not describe all possible side effects. Call your doctor for medical advice about side effects. You may report side effects to FDA at 1-800-FDA-1088. Where should I keep my medication? Keep out of the reach of children and pets. Store at room temperature between 15 and 30 degrees C (59 and 86 degrees F). Protect from light and moisture. Get rid of any unused medication after the expiration date. To get rid of medications that are no longer needed or have expired: Take the medication to a medication take-back program. Check with your pharmacy or law enforcement to find a location. If you cannot return the medication, check the label or package insert to see if the medication should be thrown out in the garbage or flushed down the toilet. If you are not sure, ask your care team. If it is safe to put it in the trash, empty the medication out of the container. Mix the medication with cat litter, dirt, coffee grounds, or other unwanted substance. Seal the mixture in a bag or container. Put it in the trash. NOTE: This sheet is a summary. It may not cover all possible information. If you have questions about this medicine, talk to your doctor, pharmacist, or health care provider.  2023 Elsevier/Gold Standard (2021-12-18  00:00:00)  Ubrogepant Tablets What is this medication? UBROGEPANT (ue BROE je pant) treats migraines. It works by blocking a substance in the body that causes migraines. It is not used to prevent migraines. This medicine may be used for other purposes; ask your health care provider or pharmacist if you have questions. COMMON BRAND NAME(S): Roselyn Meier What should I tell my care team before I take this medication? They need to know if you have any of these conditions: Kidney disease Liver disease An unusual or allergic reaction to ubrogepant, other medications, foods, dyes, or preservatives Pregnant or trying to get pregnant Breast-feeding How should I use this medication? Take this medication by mouth with a glass of water. Take it as directed on the prescription label. You can take it with or without food. If it upsets your stomach, take it with food. Keep taking it unless your care team tells you to stop. Talk to your care team about the use of this medication in children. Special care may be needed. Overdosage: If you think you have taken too much of this medicine contact a poison control center or emergency  room at once. NOTE: This medicine is only for you. Do not share this medicine with others. What if I miss a dose? This does not apply. This medication is not for regular use. What may interact with this medication? Do not take this medication with any of the following: Adagrasib Ceritinib Certain antibiotics, such as chloramphenicol, clarithromycin, telithromycin Certain antivirals for HIV, such as atazanavir, cobicistat, darunavir, delavirdine, fosamprenavir, indinavir, ritonavir Certain medications for fungal infections, such as itraconazole, ketoconazole, posaconazole, voriconazole Conivaptan Grapefruit Idelalisib Mifepristone Nefazodone Ribociclib This medication may also interact with the following: Carvedilol Certain medications for seizures, such as phenobarbital,  phenytoin Ciprofloxacin Cyclosporine Eltrombopag Fluconazole Fluvoxamine Quinidine Rifampin St. John's wort Verapamil This list may not describe all possible interactions. Give your health care provider a list of all the medicines, herbs, non-prescription drugs, or dietary supplements you use. Also tell them if you smoke, drink alcohol, or use illegal drugs. Some items may interact with your medicine. What should I watch for while using this medication? Visit your care team for regular checks on your progress. Tell your care team if your symptoms do not start to get better or if they get worse. Your mouth may get dry. Chewing sugarless gum or sucking hard candy and drinking plenty of water may help. Contact your care team if the problem does not go away or is severe. What side effects may I notice from receiving this medication? Side effects that you should report to your care team as soon as possible: Allergic reactions--skin rash, itching, hives, swelling of the face, lips, tongue, or throat Side effects that usually do not require medical attention (report to your care team if they continue or are bothersome): Drowsiness Dry mouth Fatigue Nausea This list may not describe all possible side effects. Call your doctor for medical advice about side effects. You may report side effects to FDA at 1-800-FDA-1088. Where should I keep my medication? Keep out of the reach of children and pets. Store between 15 and 30 degrees C (59 and 86 degrees F). Get rid of any unused medication after the expiration date. To get rid of medications that are no longer needed or have expired: Take the medication to a medication take-back program. Check with your pharmacy or law enforcement to find a location. If you cannot return the medication, check the label or package insert to see if the medication should be thrown out in the garbage or flushed down the toilet. If you are not sure, ask your care team. If it  is safe to put it in the trash, pour the medication out of the container. Mix the medication with cat litter, dirt, coffee grounds, or other unwanted substance. Seal the mixture in a bag or container. Put it in the trash. NOTE: This sheet is a summary. It may not cover all possible information. If you have questions about this medicine, talk to your doctor, pharmacist, or health care provider.  2023 Elsevier/Gold Standard (2021-10-08 00:00:00)

## 2022-06-29 NOTE — Progress Notes (Signed)
[facility]    Provider:  Dr Jaynee Eagles Requesting Provider: Lyman Bishop, DO Primary Care Provider:  Maurice Small, MD  CC: Migraines  HPI:  Joy Walsh is a 61 y.o. female here as requested by Lyman Bishop, DO for migraine/headaches.  Past medical history of migraines, benign neoplasm of cerebral meninges, transient alteration of awareness (appears she is seen Dr. Scherrie Bateman, Dr. Delice Lesch and Charlott Holler in neurology years ago).  Past medical history type 2 diabetes, low back pain, benign meningioma she has been evaluated by neurology and seen [facility] neurosurgery/oncology Dr. Yetta Glassman.  I reviewed Dr. Shanda Bumps notes, tumor stable, he believes is stable, has been stable for years, he recommended following back up with him in several years for repeat MRI of the scan.   She has headaches/migraines. A few months ago she had a bad migraine and tried zomig acutely which helps. Hasn't had a headache/migraine for 2 months, very rare headache/migraines, in the frontal rea, nausea, dizziness, pounding/pulsating/throbbing, light and sound sensitivity, sleep helps. Zomig knowcks her out will try rizatriptan. She has visual changes with ehr migraines, discussed aura gave literature also increased risk of stroke in women with migraine with auras. Had a really bad bout of migraines a few months ago, worried about the meningioma, has some aura posisbly, no weakness or other symptoms. Has dizziness. No other focal neurologic deficits, associated symptoms, inciting events or modifiable factors.  Reviewed notes, labs and imaging from outside physicians, which showed:  From a thorough review of records, medications tried that can be used in migraine management include Zomig, Topamax, Zofran, bisoprolol, Tylenol, ibuprofen, nortriptyline, sumatriptan hand.  MRI brain 2019: COMPARISON:  Brain MRI 09/07/2011. Head CT without contrast 11/09/2016.   FINDINGS: Brain: Partially calcified chronic  left anterior parafalcine meningioma size is estimated at 13 millimeters diameter today (series 8, image 24), not significantly changed since 2013. Minimal regional mass effect and no cerebral edema.   Normal cerebral volume. No restricted diffusion to suggest acute infarction. No midline shift, mass effect, evidence of mass lesion, ventriculomegaly, extra-axial collection or acute intracranial hemorrhage. Cervicomedullary junction and pituitary are within normal limits.   Pearline Cables and white matter signal remains within normal limits for age throughout the brain. No encephalomalacia or chronic cerebral blood products identified.   Vascular: Major intracranial vascular flow voids are stable since 2013.   Skull and upper cervical spine: Negative visible cervical spine. Visualized bone marrow signal is within normal limits.   Sinuses/Orbits: Normal orbit soft tissues. Mild paranasal sinus mucosal thickening has not significantly changed since 2013.   Other: Mastoids remain clear. Visible internal auditory structures appear normal. Scalp and face soft tissues appear negative.   IMPRESSION: 1. A small anterior left parafalcine meningioma appears not significantly changed in size since 2013, and is likely clinically silent with no associated cerebral edema. 2. Otherwise normal noncontrast MRI appearance of the brain.   05/2021: esr nml  Just had a full physical with eagle and all labs normal per patient will not retest  Review of Systems: Patient complains of symptoms per HPI as well as the following symptoms headache. Pertinent negatives and positives per HPI. All others negative.   Social History   Socioeconomic History   Marital status: Single    Spouse name: Not on file   Number of children: 5   Years of education: college   Highest education level: Not on file  Occupational History   Occupation: Real Estate    Comment: Self Employed  Tobacco Use   Smoking status: Never    Smokeless tobacco: Never  Substance and Sexual Activity   Alcohol use: Yes    Alcohol/week: 3.0 standard drinks of alcohol    Types: 3 Shots of liquor per week    Comment: Occ   Drug use: No   Sexual activity: Never  Other Topics Concern   Not on file  Social History Narrative   Pt lives at home with her roommate. Pt has her masters's degree. Pt has five children. Pt denies any history of tobacco and illicit drugs. Pt drinks alcohol once a week.   Caffeine Use: 3-4 times weekly   Social Determinants of Health   Financial Resource Strain: Not on file  Food Insecurity: Not on file  Transportation Needs: Not on file  Physical Activity: Not on file  Stress: Not on file  Social Connections: Not on file  Intimate Partner Violence: Not on file    Family History  Problem Relation Age of Onset   Heart Problems Father    Migraines Neg Hx    Headache Neg Hx     Past Medical History:  Diagnosis Date   Hypertension    Meningioma ([place])    Migraine    Mitral valve prolapse     Patient Active Problem List   Diagnosis Date Noted   Meningioma, cerebral ([place]) 06/29/2022   Frontal headache 06/29/2022   Migraine with aura and without status migrainosus, not intractable 06/29/2022   Transient alteration of awareness 11/05/2015   Migraine without aura and without status migrainosus, not intractable 11/05/2015   Migraine 09/11/2014   Benign neoplasm of cerebral meninges ([place]) 06/19/2013    Past Surgical History:  Procedure Laterality Date   none      Current Outpatient Medications  Medication Sig Dispense Refill   bisoprolol (ZEBETA) 10 MG tablet Take 10 mg by mouth daily.     hydrochlorothiazide (HYDRODIURIL) 25 MG tablet Take 25 mg by mouth daily.     ibuprofen (ADVIL,MOTRIN) 200 MG tablet Take 200 mg by mouth as needed for pain. Reported on 11/05/2015     omeprazole (PRILOSEC) 10 MG capsule Take 10 mg by mouth daily.     ondansetron (ZOFRAN-ODT) 4 MG disintegrating tablet  Take 1-2 tablets (4-8 mg total) by mouth every 8 (eight) hours as needed. Take one pill with rizatriptan 30 tablet 3   rizatriptan (MAXALT-MLT) 5 MG disintegrating tablet Take 1 tablet (5 mg total) by mouth as needed for migraine. May repeat in 2 hours if needed 10 tablet 11   Ubrogepant (UBRELVY) 100 MG TABS Take 100 mg by mouth every 2 (two) hours as needed. Maximum 262m a day. 2 tablet 0   No current facility-administered medications for this visit.   Facility-Administered Medications Ordered in Other Visits  Medication Dose Route Frequency Provider Last Rate Last Admin   gadopentetate dimeglumine (MAGNEVIST) injection 15 mL  15 mL Intravenous Once PRN AMelvenia Beam MD       gadopentetate dimeglumine (MAGNEVIST) injection 18 mL  18 mL Intravenous Once PRN YMarcial Pacas MD        Allergies as of 06/29/2022 - Review Complete 06/29/2022  Allergen Reaction Noted   Amoxicillin-pot clavulanate  10/10/2015   Sulfa antibiotics Rash 05/29/2013    Vitals: BP 117/68   Pulse 61   Ht _0  (1.651 m)   Wt 194 lb (88 kg)   LMP 10/15/2013   BMI 32.28 kg/m  Last Weight:  Wt Readings from Last  1 Encounters:  06/29/22 194 lb (88 kg)   Last Height:   Ht Readings from Last 1 Encounters:  06/29/22 _0  (1.651 m)     Physical exam: Exam: Gen: NAD, conversant, well nourised, obese, well groomed                     CV: RRR, no MRG. No Carotid Bruits. No peripheral edema, warm, nontender Eyes: Conjunctivae clear without exudates or hemorrhage  Neuro: Detailed Neurologic Exam  Speech:    Speech is normal; fluent and spontaneous with normal comprehension.  Cognition:    The patient is oriented to person, place, and time;     recent and remote memory intact;     language fluent;     normal attention, concentration,     fund of knowledge Cranial Nerves:    The pupils are equal, round, and reactive to light. The fundi are flat Visual fields are full to finger confrontation. Extraocular  movements are intact. Trigeminal sensation is intact and the muscles of mastication are normal. The face is symmetric. The palate elevates in the midline. Hearing intact. Voice is normal. Shoulder shrug is normal. The tongue has normal motion without fasciculations.   Coordination:    Normal  Gait:    normal.   Motor Observation:    No asymmetry, no atrophy, and no involuntary movements noted. Tone:    Normal muscle tone.    Posture:    Posture is normal. normal erect    Strength:    Strength is V/V in the upper and lower limbs.      Sensation: intact to LT     Reflex Exam:  DTR's:    Deep tendon reflexes in the upper and lower extremities are normal bilaterally.   Toes:    The toes are downgoing bilaterally.   Clonus:    Clonus is absent.    Assessment/Plan:  Patient with episodic migraines and meningioma  - Zomig causes sedation, try rizatriptan low dose, very infrequent migraines, will try low dose Rizatriptan  - MRI of the brain to follow the frontal meningioma, hasn't had an MRI in 4-5 years, headache is frontal where the meningioma is, recommend following for enlargement and impact on the brain   - Acute Migraine: Stop zomig and try Rizatriptan: Please take one tablet at the onset of your headache. If it does not improve the symptoms please take one additional tablet. Do not take more then 2 tablets in 24hrs. Do not take use more then 2 to 3 times in a week.IF this causes sedation next thing I would try is nurtec or ubrelvy. Can take with an ondansetron. -Try ubrogepant sample  Discussed:   There is increased risk for stroke in women with migraine with aura and a contraindication for the combined contraceptive pill for use by women who have migraine with aura. The risk for women with migraine without aura is lower. However other risk factors like smoking are far more likely to increase stroke risk than migraine. There is a recommendation for no smoking and for the use  of OCPs without estrogen such as progestogen only pills particularly for women with migraine with aura.Marland Kitchen People who have migraine headaches with auras may be 3 times more likely to have a stroke caused by a blood clot, compared to migraine patients who don't see auras. Women who take hormone-replacement therapy may be 30 percent more likely to suffer a clot-based stroke than women not taking medication containing  estrogen. Other risk factors like smoking and high blood pressure may be  much more important.  To prevent or relieve headaches, try the following: Cool Compress. Lie down and place a cool compress on your head.  Avoid headache triggers. If certain foods or odors seem to have triggered your migraines in the past, avoid them. A headache diary might help you identify triggers.  Include physical activity in your daily routine. Try a daily walk or other moderate aerobic exercise.  Manage stress. Find healthy ways to cope with the stressors, such as delegating tasks on your to-do list.  Practice relaxation techniques. Try deep breathing, yoga, massage and visualization.  Eat regularly. Eating regularly scheduled meals and maintaining a healthy diet might help prevent headaches. Also, drink plenty of fluids.  Follow a regular sleep schedule. Sleep deprivation might contribute to headaches Consider biofeedback. With this mind-body technique, you learn to control certain bodily functions -- such as muscle tension, heart rate and blood pressure -- to prevent headaches or reduce headache pain.    Proceed to emergency room if you experience new or worsening symptoms or symptoms do not resolve, if you have new neurologic symptoms or if headache is severe, or for any concerning symptom.   Provided education and documentation from American headache Society toolbox including articles on: chronic migraine medication overuse headache, chronic migraines, prevention of migraines, behavioral and other  nonpharmacologic treatments for headache.   Orders Placed This Encounter  Procedures   MR BRAIN W WO CONTRAST   Basic Metabolic Panel   Meds ordered this encounter  Medications   rizatriptan (MAXALT-MLT) 5 MG disintegrating tablet    Sig: Take 1 tablet (5 mg total) by mouth as needed for migraine. May repeat in 2 hours if needed    Dispense:  10 tablet    Refill:  11   ondansetron (ZOFRAN-ODT) 4 MG disintegrating tablet    Sig: Take 1-2 tablets (4-8 mg total) by mouth every 8 (eight) hours as needed. Take one pill with rizatriptan    Dispense:  30 tablet    Refill:  3   Ubrogepant (UBRELVY) 100 MG TABS    Sig: Take 100 mg by mouth every 2 (two) hours as needed. Maximum 248m a day.    Dispense:  2 tablet    Refill:  0    Cc: Masneri, SAdron Bene MD  ASarina Ill MD  [facility] [address] [address] Phone [phone] [phone]

## 2022-06-30 LAB — BASIC METABOLIC PANEL
BUN/Creatinine Ratio: 19 (ref 12–28)
BUN: 17 mg/dL (ref 8–27)
CO2: 27 mmol/L (ref 20–29)
Calcium: 9.8 mg/dL (ref 8.7–10.3)
Chloride: 103 mmol/L (ref 96–106)
Creatinine, Ser: 0.9 mg/dL (ref 0.57–1.00)
Glucose: 138 mg/dL — ABNORMAL HIGH (ref 70–99)
Potassium: 4.6 mmol/L (ref 3.5–5.2)
Sodium: 144 mmol/L (ref 134–144)
eGFR: 73 mL/min/{1.73_m2} (ref >59)

## 2022-07-09 ENCOUNTER — Telehealth: Payer: Self-pay | Admitting: Neurology

## 2022-07-09 NOTE — Telephone Encounter (Signed)
[employer] Josem Kaufmann: [id number] exp. 07/09/22-08/07/22 sent to GI [phone]

## 2022-07-16 ENCOUNTER — Ambulatory Visit (HOSPITAL_BASED_OUTPATIENT_CLINIC_OR_DEPARTMENT_OTHER): Payer: BLUE CROSS/BLUE SHIELD | Admitting: Radiology

## 2022-07-28 ENCOUNTER — Ambulatory Visit
Admission: RE | Admit: 2022-07-28 | Discharge: 2022-07-28 | Disposition: A | Discharge status: Home / Self Care | Payer: BC Managed Care – PPO | Source: Ambulatory Visit | Attending: Neurology | Admitting: Neurology

## 2022-07-28 DIAGNOSIS — D320 Benign neoplasm of cerebral meninges: Secondary | ICD-10-CM

## 2022-07-28 DIAGNOSIS — R519 Headache, unspecified: Secondary | ICD-10-CM | POA: Diagnosis not present

## 2022-07-28 DIAGNOSIS — H539 Unspecified visual disturbance: Secondary | ICD-10-CM

## 2022-07-28 MED ORDER — GADOPICLENOL 0.5 MMOL/ML IV SOLN
9.0000 mL | Freq: Once | INTRAVENOUS | Status: AC | PRN
  Administered 2022-07-28: 9 mL via INTRAVENOUS

## 2022-07-30 ENCOUNTER — Ambulatory Visit (HOSPITAL_BASED_OUTPATIENT_CLINIC_OR_DEPARTMENT_OTHER)
Admission: RE | Admit: 2022-07-30 | Discharge: 2022-07-30 | Disposition: A | Discharge status: Home / Self Care | Payer: BC Managed Care – PPO | Source: Ambulatory Visit | Attending: Family Medicine | Admitting: Family Medicine

## 2022-07-30 DIAGNOSIS — Z1231 Encounter for screening mammogram for malignant neoplasm of breast: Secondary | ICD-10-CM | POA: Diagnosis not present

## 2022-08-10 DIAGNOSIS — E119 Type 2 diabetes mellitus without complications: Secondary | ICD-10-CM | POA: Diagnosis not present

## 2022-08-10 DIAGNOSIS — H5203 Hypermetropia, bilateral: Secondary | ICD-10-CM | POA: Diagnosis not present

## 2022-08-10 DIAGNOSIS — H524 Presbyopia: Secondary | ICD-10-CM | POA: Diagnosis not present

## 2022-09-25 DIAGNOSIS — J019 Acute sinusitis, unspecified: Secondary | ICD-10-CM | POA: Diagnosis not present

## 2022-11-04 DIAGNOSIS — Z1211 Encounter for screening for malignant neoplasm of colon: Secondary | ICD-10-CM | POA: Diagnosis not present

## 2022-11-11 DIAGNOSIS — I10 Essential (primary) hypertension: Secondary | ICD-10-CM | POA: Diagnosis not present

## 2022-11-11 DIAGNOSIS — Z87898 Personal history of other specified conditions: Secondary | ICD-10-CM | POA: Diagnosis not present

## 2022-11-18 DIAGNOSIS — M791 Myalgia, unspecified site: Secondary | ICD-10-CM | POA: Diagnosis not present

## 2022-12-09 DIAGNOSIS — I10 Essential (primary) hypertension: Secondary | ICD-10-CM | POA: Diagnosis not present

## 2022-12-14 DIAGNOSIS — I10 Essential (primary) hypertension: Secondary | ICD-10-CM | POA: Diagnosis not present

## 2023-01-11 ENCOUNTER — Telehealth: Payer: Self-pay | Admitting: Neurology

## 2023-01-11 NOTE — Telephone Encounter (Signed)
Sent mychart msg informing pt of appt change due to provider schedule change 

## 2023-01-22 DIAGNOSIS — M79672 Pain in left foot: Secondary | ICD-10-CM | POA: Diagnosis not present

## 2023-01-22 DIAGNOSIS — M7732 Calcaneal spur, left foot: Secondary | ICD-10-CM | POA: Diagnosis not present

## 2023-03-10 DIAGNOSIS — R7989 Other specified abnormal findings of blood chemistry: Secondary | ICD-10-CM | POA: Diagnosis not present

## 2023-03-25 ENCOUNTER — Ambulatory Visit (INDEPENDENT_AMBULATORY_CARE_PROVIDER_SITE_OTHER): Payer: 59

## 2023-03-25 ENCOUNTER — Ambulatory Visit: Payer: 59 | Admitting: Podiatry

## 2023-03-25 DIAGNOSIS — M778 Other enthesopathies, not elsewhere classified: Secondary | ICD-10-CM | POA: Diagnosis not present

## 2023-03-25 DIAGNOSIS — M898X7 Other specified disorders of bone, ankle and foot: Secondary | ICD-10-CM

## 2023-03-25 DIAGNOSIS — M205X1 Other deformities of toe(s) (acquired), right foot: Secondary | ICD-10-CM | POA: Diagnosis not present

## 2023-03-25 MED ORDER — METHYLPREDNISOLONE 4 MG PO TBPK: ORAL_TABLET | ORAL | 0 refills | Status: AC

## 2023-03-25 MED ORDER — MELOXICAM 15 MG PO TABS: 15.0000 mg | ORAL_TABLET | Freq: Every day | ORAL | 0 refills | Status: AC

## 2023-03-25 NOTE — Progress Notes (Signed)
Subjective:  Patient ID: Joy Walsh, female    DOB: [birth year],  MRN: [id number]  Chief Complaint  Patient presents with   Foot Pain    Left heel- bone spur. Patient was using cam boot x 2 weeks.   Right hallux joint pain. Patient was taking gout medication with not relief.     62 y.o. female presents with concern for pain in the left heel possible bone spur.  She says the pain is at the back of the heel.  She says she went to an outside podiatrist office and they took x-rays telling her she had a fractured bone spur at the back of her heel.  It recommended she go into a cam boot.  She has not been taking any medications.  There are also concern for right great toe joint pain and says that the patient previously had a history of gout so they prescribed colchicine.    Past Medical History:  Diagnosis Date   Hypertension    Meningioma ([facility])    Migraine    Mitral valve prolapse     Allergies  Allergen Reactions   Amoxicillin-Pot Clavulanate     Other reaction(s): GI Intolerance   Sulfa Antibiotics Rash    ROS: Negative except as per HPI above  Objective:  General: AAO x3, NAD  Dermatological: With inspection and palpation of the right and left lower extremities there are no open sores, no preulcerative lesions, no rash or signs of infection present. Nails are of normal length thickness and coloration.   Vascular:  Dorsalis Pedis artery and Posterior Tibial artery pedal pulses are 2/4 bilateral.  Capillary fill time < 3 sec to all digits.   Neruologic: Grossly intact via light touch bilateral. Protective threshold intact to all sites bilateral.   Musculoskeletal: Pain with palpation of the posterior aspect of the left heel at the insertion point of the Achilles tendon.  There is a small prominence noted consistent with bone spur seen on x-ray.  There is also pain in the right first metatarsal phalangeal joint which is increased with motion.  Bone spur is present at the medial  aspect of the first metatarsal head mild bunion deformity.  Gait: Unassisted, Nonantalgic.   No images are attached to the encounter.  Radiographs:  Date: 03/25/2023 XR both feet Weightbearing AP/Lateral/Oblique   Findings: Attention directed to the left heel there is noted to be possible fracture retrocalcaneal exostosis versus internal Achilles tendon calcification.  On the right foot at the first metatarsal phalangeal joint there is noted the significant irregularity of the joint line decreased joint space subchondral sclerosis. Assessment:   1. Retrocalcaneal exostosis   2. Hallux limitus of right foot   3. Capsulitis of foot      Plan:  Patient was evaluated and treated and all questions answered.  # Retrocalcaneal exostosis of left foot, insertional Achilles calcific tendinosis -Discussed with patient she has evidence of posterior heel spur on the back of the left heel.  This is at the insertion of the Achilles and there is some likely insertional Achilles tendinitis and possible intra tendinous calcification. -Discussed conservative and surgical treatment options for this issue -We will try methylprednisolone 4 mg steroid taper pack take as directed for 6 days -eRx for meloxicam 15 mg take daily for the next 30 days] -Recommend use of heel lifts for shoes -If not improved in the future would have to consider retrocalcaneal exostectomy with secondary repair of Achilles tendon.  Briefly discussed this procedure with  the patient  # Hallux limitus of right foot -Discussed with the patient she does have evidence of arthritic changes in the first metatarsophalangeal joint of the right foot.  Believe this is causing her pain versus gout -Recommend anti-inflammatory medication such as meloxicam and methylprednisolone as above -Discussed that if this does not get the problem under control we will consider steroid injection at next visit  Return in about 4 weeks (around 04/22/2023) for  Follow-up left posterior heel pain.          Corinna Gab, DPM [facility] / [facility]

## 2023-04-29 ENCOUNTER — Ambulatory Visit: Payer: 59 | Admitting: Podiatry

## 2023-04-29 DIAGNOSIS — M898X7 Other specified disorders of bone, ankle and foot: Secondary | ICD-10-CM | POA: Diagnosis not present

## 2023-04-29 DIAGNOSIS — M205X1 Other deformities of toe(s) (acquired), right foot: Secondary | ICD-10-CM

## 2023-04-29 NOTE — Progress Notes (Signed)
Subjective:  Patient ID: Joy Walsh, female    DOB: September 17, 1960,  MRN: [id number]  Chief Complaint  Patient presents with   Follow-up    Left posterior heel pain. Patient is doing very well. Denies any pain at this time. Patient finished course of steroids and continues to take Mobic.             62 y.o. female presents with concern for pain in the left heel possible bone spur.  She says the pain is at the back of the heel.  She says she went to an outside podiatrist office and they took x-rays telling her she had a fractured bone spur at the back of her heel.  It recommended she go into a cam boot.  She has not been taking any medications.  There are also concern for right great toe joint pain and says that the patient previously had a history of gout so they prescribed colchicine.    Past Medical History:  Diagnosis Date   Hypertension    Meningioma ([facility])    Migraine    Mitral valve prolapse     Allergies  Allergen Reactions   Amoxicillin Other (See Comments)    Patient states amoxicillin does not clear up her infections.   Amoxicillin-Pot Clavulanate     Other reaction(s): GI Intolerance   Clarithromycin Other (See Comments)   Sulfa Antibiotics Rash    ROS: Negative except as per HPI above  Objective:  General: AAO x3, NAD  Dermatological: With inspection and palpation of the right and left lower extremities there are no open sores, no preulcerative lesions, no rash or signs of infection present. Nails are of normal length thickness and coloration.   Vascular:  Dorsalis Pedis artery and Posterior Tibial artery pedal pulses are 2/4 bilateral.  Capillary fill time < 3 sec to all digits.   Neruologic: Grossly intact via light touch bilateral. Protective threshold intact to all sites bilateral.   Musculoskeletal: Pain with palpation of the posterior aspect of the left heel at the insertion point of the Achilles tendon.  There is a small prominence noted consistent with  bone spur seen on x-ray.  There is also pain in the right first metatarsal phalangeal joint which is increased with motion.  Bone spur is present at the medial aspect of the first metatarsal head mild bunion deformity.  Gait: Unassisted, Nonantalgic.   No images are attached to the encounter.  Radiographs:  Date: 03/25/2023 XR both feet Weightbearing AP/Lateral/Oblique   Findings: Attention directed to the left heel there is noted to be possible fracture retrocalcaneal exostosis versus internal Achilles tendon calcification.  On the right foot at the first metatarsal phalangeal joint there is noted the significant irregularity of the joint line decreased joint space subchondral sclerosis. Assessment:   1. Retrocalcaneal exostosis   2. Hallux limitus of right foot       Plan:  Patient was evaluated and treated and all questions answered.  # Retrocalcaneal exostosis of left foot, insertional Achilles calcific tendinosis -Much improved after steroid Dosepak as well as meloxicam therapy -Continue to monitor with icing stretching and ibuprofen as needed -heel lift also recommended as well as good supportive shoes with thick heel  # Hallux limitus of right foot -No indication for injection at this time patient is not having pain in the right great toe joint --Much improved after steroid Dosepak as well as meloxicam therapy -Continue to monitor with icing  and ibuprofen as needed  Return if  symptoms worsen or fail to improve.          Corinna Gab, DPM [facility] / [facility]

## 2023-07-05 ENCOUNTER — Telehealth: Payer: BC Managed Care – PPO | Admitting: Neurology

## 2023-07-08 ENCOUNTER — Encounter: Payer: Self-pay | Admitting: Neurology

## 2023-07-08 ENCOUNTER — Telehealth: Payer: BC Managed Care – PPO | Admitting: Neurology

## 2023-07-08 NOTE — Progress Notes (Deleted)
[facility]    Provider:  Dr Lucia Gaskins Requesting Provider: Shirlean Mylar, MD Primary Care Provider:  [facility]  CC: Migraines  HPI:  Joy Walsh is a 62 y.o. female here as requested by Shirlean Mylar, MD for migraine/headaches.  Past medical history of migraines, benign neoplasm of cerebral meninges, transient alteration of awareness (appears she is seen Dr. Murrell Converse, Dr. Karel Jarvis and Heide Guile in neurology years ago).  Past medical history type 2 diabetes, low back pain, benign meningioma she has been evaluated by neurology and seen [facility] neurosurgery/oncology Dr. Velna Ochs.  I reviewed Dr. Santiago Glad notes, tumor stable, he believes is stable, has been stable for years, he recommended following back up with him in several years for repeat MRI of the scan.   She has headaches/migraines. A few months ago she had a bad migraine and tried zomig acutely which helps. Hasn't had a headache/migraine for 2 months, very rare headache/migraines, in the frontal rea, nausea, dizziness, pounding/pulsating/throbbing, light and sound sensitivity, sleep helps. Zomig knowcks her out will try rizatriptan. She has visual changes with ehr migraines, discussed aura gave literature also increased risk of stroke in women with migraine with auras. Had a really bad bout of migraines a few months ago, worried about the meningioma, has some aura posisbly, no weakness or other symptoms. Has dizziness. No other focal neurologic deficits, associated symptoms, inciting events or modifiable factors.  Reviewed notes, labs and imaging from outside physicians, which showed:  From a thorough review of records, medications tried that can be used in migraine management include Zomig, Topamax, Zofran, bisoprolol, Tylenol, ibuprofen, nortriptyline, sumatriptan hand.  MRI brain 2019: COMPARISON:  Brain MRI 09/07/2011. Head CT without contrast 11/09/2016.   FINDINGS: Brain: Partially calcified  chronic left anterior parafalcine meningioma size is estimated at 13 millimeters diameter today (series 8, image 24), not significantly changed since 2013. Minimal regional mass effect and no cerebral edema.   Normal cerebral volume. No restricted diffusion to suggest acute infarction. No midline shift, mass effect, evidence of mass lesion, ventriculomegaly, extra-axial collection or acute intracranial hemorrhage. Cervicomedullary junction and pituitary are within normal limits.   Wallace Cullens and white matter signal remains within normal limits for age throughout the brain. No encephalomalacia or chronic cerebral blood products identified.   Vascular: Major intracranial vascular flow voids are stable since 2013.   Skull and upper cervical spine: Negative visible cervical spine. Visualized bone marrow signal is within normal limits.   Sinuses/Orbits: Normal orbit soft tissues. Mild paranasal sinus mucosal thickening has not significantly changed since 2013.   Other: Mastoids remain clear. Visible internal auditory structures appear normal. Scalp and face soft tissues appear negative.   IMPRESSION: 1. A small anterior left parafalcine meningioma appears not significantly changed in size since 2013, and is likely clinically silent with no associated cerebral edema. 2. Otherwise normal noncontrast MRI appearance of the brain.   05/2021: esr nml  Just had a full physical with [facility] and all labs normal per patient will not retest  Review of Systems: Patient complains of symptoms per HPI as well as the following symptoms headache. Pertinent negatives and positives per HPI. All others negative.   Social History   Socioeconomic History   Marital status: Single    Spouse name: Not on file   Number of children: 5   Years of education: college   Highest education level: Not on file  Occupational History   Occupation: Real Estate    Comment: Self  Employed  Tobacco Use   Smoking  status: Never   Smokeless tobacco: Never  Substance and Sexual Activity   Alcohol use: Yes    Alcohol/week: 3.0 standard drinks of alcohol    Types: 3 Shots of liquor per week    Comment: Occ   Drug use: No   Sexual activity: Never  Other Topics Concern   Not on file  Social History Narrative   Pt lives at home with her roommate. Pt has her masters's degree. Pt has five children. Pt denies any history of tobacco and illicit drugs. Pt drinks alcohol once a week.   Caffeine Use: 3-4 times weekly   Social Determinants of Health   Financial Resource Strain: Not on file  Food Insecurity: Not on file  Transportation Needs: Not on file  Physical Activity: Not on file  Stress: Not on file  Social Connections: Unknown (11/18/2019)   Received [facility]   Social Connections    Frequency of Communication with Friends and Family: Not asked    Frequency of Social Gatherings with Friends and Family: Not asked  Intimate Partner Violence: Unknown (11/18/2019)   Received from [facility]   Intimate Partner Violence    Fear of Current or Ex-Partner: Not asked    Emotionally Abused: Not asked    Physically Abused: Not asked    Sexually Abused: Not asked    Family History  Problem Relation Age of Onset   Heart Problems Father    Migraines Neg Hx    Headache Neg Hx     Past Medical History:  Diagnosis Date   Hypertension    Meningioma (HCC)    Migraine    Mitral valve prolapse     Patient Active Problem List   Diagnosis Date Noted   Meningioma, cerebral (HCC) 06/29/2022   Frontal headache 06/29/2022   Migraine with aura and without status migrainosus, not intractable 06/29/2022   Transient alteration of awareness 11/05/2015   Migraine without aura and without status migrainosus, not intractable 11/05/2015   Migraine 09/11/2014   Benign neoplasm of cerebral meninges (HCC) 06/19/2013    Past Surgical History:  Procedure Laterality Date    none      Current Outpatient Medications  Medication Sig Dispense Refill   bisoprolol (ZEBETA) 10 MG tablet Take 10 mg by mouth daily.     hydrochlorothiazide (HYDRODIURIL) 25 MG tablet Take 25 mg by mouth daily.     ibuprofen (ADVIL,MOTRIN) 200 MG tablet Take 200 mg by mouth as needed for pain. Reported on 11/05/2015     meloxicam (MOBIC) 15 MG tablet Take 1 tablet (15 mg total) by mouth daily. 30 tablet 0   methylPREDNISolone (MEDROL DOSEPAK) 4 MG TBPK tablet Take as directed for 6 days 1 each 0   omeprazole (PRILOSEC) 10 MG capsule Take 10 mg by mouth daily.     ondansetron (ZOFRAN-ODT) 4 MG disintegrating tablet Take 1-2 tablets (4-8 mg total) by mouth every 8 (eight) hours as needed. Take one pill with rizatriptan 30 tablet 3   rizatriptan (MAXALT-MLT) 5 MG disintegrating tablet Take 1 tablet (5 mg total) by mouth as needed for migraine. May repeat in 2 hours if needed 10 tablet 11   Ubrogepant (UBRELVY) 100 MG TABS Take 100 mg by mouth every 2 (two) hours as needed. Maximum 200mg  a day. 2 tablet 0   No current facility-administered medications for this visit.   Facility-Administered Medications Ordered in Other Visits  Medication Dose  Route Frequency Provider Last Rate Last Admin   gadopentetate dimeglumine (MAGNEVIST) injection 15 mL  15 mL Intravenous Once PRN Anson Fret, MD       gadopentetate dimeglumine (MAGNEVIST) injection 18 mL  18 mL Intravenous Once PRN Levert Feinstein, MD        Allergies as of 07/08/2023 - Review Complete 04/29/2023  Allergen Reaction Noted   Amoxicillin Other (See Comments) 09/18/2015   Amoxicillin-pot clavulanate  10/10/2015   Clarithromycin Other (See Comments) 01/22/2023   Sulfa antibiotics Rash 05/29/2013    Vitals: LMP 10/15/2013  Last Weight:  Wt Readings from Last 1 Encounters:  06/29/22 194 lb (88 kg)   Last Height:   Ht Readings from Last 1 Encounters:  06/29/22 5\' 5"  (1.651 m)     Physical exam: Exam: Gen: NAD, conversant, well  nourised, obese, well groomed                     CV: RRR, no MRG. No Carotid Bruits. No peripheral edema, warm, nontender Eyes: Conjunctivae clear without exudates or hemorrhage  Neuro: Detailed Neurologic Exam  Speech:    Speech is normal; fluent and spontaneous with normal comprehension.  Cognition:    The patient is oriented to person, place, and time;     recent and remote memory intact;     language fluent;     normal attention, concentration,     fund of knowledge Cranial Nerves:    The pupils are equal, round, and reactive to light. The fundi are flat Visual fields are full to finger confrontation. Extraocular movements are intact. Trigeminal sensation is intact and the muscles of mastication are normal. The face is symmetric. The palate elevates in the midline. Hearing intact. Voice is normal. Shoulder shrug is normal. The tongue has normal motion without fasciculations.   Coordination:    Normal  Gait:    normal.   Motor Observation:    No asymmetry, no atrophy, and no involuntary movements noted. Tone:    Normal muscle tone.    Posture:    Posture is normal. normal erect    Strength:    Strength is V/V in the upper and lower limbs.      Sensation: intact to LT     Reflex Exam:  DTR's:    Deep tendon reflexes in the upper and lower extremities are normal bilaterally.   Toes:    The toes are downgoing bilaterally.   Clonus:    Clonus is absent.    Assessment/Plan:  Patient with episodic migraines and meningioma  - Zomig causes sedation, try rizatriptan low dose, very infrequent migraines, will try low dose Rizatriptan  - MRI of the brain to follow the frontal meningioma, hasn't had an MRI in 4-5 years, headache is frontal where the meningioma is, recommend following for enlargement and impact on the brain   - Acute Migraine: Stop zomig and try Rizatriptan: Please take one tablet at the onset of your headache. If it does not improve the symptoms please  take one additional tablet. Do not take more then 2 tablets in 24hrs. Do not take use more then 2 to 3 times in a week.IF this causes sedation next thing I would try is nurtec or ubrelvy. Can take with an ondansetron. -Try ubrogepant sample  Discussed:   There is increased risk for stroke in women with migraine with aura and a contraindication for the combined contraceptive pill for use by women who have migraine with aura.  The risk for women with migraine without aura is lower. However other risk factors like smoking are far more likely to increase stroke risk than migraine. There is a recommendation for no smoking and for the use of OCPs without estrogen such as progestogen only pills particularly for women with migraine with aura.Marland Kitchen People who have migraine headaches with auras may be 3 times more likely to have a stroke caused by a blood clot, compared to migraine patients who don't see auras. Women who take hormone-replacement therapy may be 30 percent more likely to suffer a clot-based stroke than women not taking medication containing estrogen. Other risk factors like smoking and high blood pressure may be  much more important.  To prevent or relieve headaches, try the following: Cool Compress. Lie down and place a cool compress on your head.  Avoid headache triggers. If certain foods or odors seem to have triggered your migraines in the past, avoid them. A headache diary might help you identify triggers.  Include physical activity in your daily routine. Try a daily walk or other moderate aerobic exercise.  Manage stress. Find healthy ways to cope with the stressors, such as delegating tasks on your to-do list.  Practice relaxation techniques. Try deep breathing, yoga, massage and visualization.  Eat regularly. Eating regularly scheduled meals and maintaining a healthy diet might help prevent headaches. Also, drink plenty of fluids.  Follow a regular sleep schedule. Sleep deprivation might  contribute to headaches Consider biofeedback. With this mind-body technique, you learn to control certain bodily functions -- such as muscle tension, heart rate and blood pressure -- to prevent headaches or reduce headache pain.    Proceed to emergency room if you experience new or worsening symptoms or symptoms do not resolve, if you have new neurologic symptoms or if headache is severe, or for any concerning symptom.   Provided education and documentation from American headache Society toolbox including articles on: chronic migraine medication overuse headache, chronic migraines, prevention of migraines, behavioral and other nonpharmacologic treatments for headache.   No orders of the defined types were placed in this encounter.  No orders of the defined types were placed in this encounter.   Cc: Shirlean Mylar, MD,  [facility], [place]  [facility] [address] [place], [place] [address]  Phone [phone] Fax [phone]

## 2023-10-24 ENCOUNTER — Encounter (HOSPITAL_COMMUNITY): Payer: Self-pay

## 2023-10-24 ENCOUNTER — Emergency Department (HOSPITAL_COMMUNITY): Payer: 59

## 2023-10-24 ENCOUNTER — Other Ambulatory Visit: Payer: Self-pay

## 2023-10-24 ENCOUNTER — Emergency Department (HOSPITAL_COMMUNITY)
Admission: EM | Admit: 2023-10-24 | Discharge: 2023-10-24 | Disposition: A | Discharge status: Home / Self Care | Payer: 59 | Attending: Emergency Medicine | Admitting: Emergency Medicine

## 2023-10-24 DIAGNOSIS — R519 Headache, unspecified: Secondary | ICD-10-CM | POA: Insufficient documentation

## 2023-10-24 DIAGNOSIS — M79603 Pain in arm, unspecified: Secondary | ICD-10-CM | POA: Diagnosis not present

## 2023-10-24 DIAGNOSIS — R030 Elevated blood-pressure reading, without diagnosis of hypertension: Secondary | ICD-10-CM | POA: Diagnosis not present

## 2023-10-24 DIAGNOSIS — R9431 Abnormal electrocardiogram [ECG] [EKG]: Secondary | ICD-10-CM | POA: Diagnosis not present

## 2023-10-24 DIAGNOSIS — M79602 Pain in left arm: Secondary | ICD-10-CM | POA: Diagnosis not present

## 2023-10-24 DIAGNOSIS — M79622 Pain in left upper arm: Secondary | ICD-10-CM | POA: Diagnosis not present

## 2023-10-24 DIAGNOSIS — I6521 Occlusion and stenosis of right carotid artery: Secondary | ICD-10-CM | POA: Diagnosis not present

## 2023-10-24 DIAGNOSIS — I10 Essential (primary) hypertension: Secondary | ICD-10-CM | POA: Diagnosis not present

## 2023-10-24 DIAGNOSIS — Z79899 Other long term (current) drug therapy: Secondary | ICD-10-CM | POA: Insufficient documentation

## 2023-10-24 DIAGNOSIS — R051 Acute cough: Secondary | ICD-10-CM | POA: Diagnosis not present

## 2023-10-24 LAB — BASIC METABOLIC PANEL
Anion gap: 10 (ref 5–15)
BUN: 19 mg/dL (ref 8–23)
CO2: 28 mmol/L (ref 22–32)
Calcium: 9.5 mg/dL (ref 8.9–10.3)
Chloride: 101 mmol/L (ref 98–111)
Creatinine, Ser: 0.78 mg/dL (ref 0.44–1.00)
GFR, Estimated: >60 mL/min (ref >60)
Glucose, Bld: 161 mg/dL — ABNORMAL HIGH (ref 70–99)
Potassium: 3.9 mmol/L (ref 3.5–5.1)
Sodium: 139 mmol/L (ref 135–145)

## 2023-10-24 LAB — CBC
HCT: 46 % (ref 36.0–46.0)
Hemoglobin: 15.8 g/dL — ABNORMAL HIGH (ref 12.0–15.0)
MCH: 32 pg (ref 26.0–34.0)
MCHC: 34.3 g/dL (ref 30.0–36.0)
MCV: 93.1 fL (ref 80.0–100.0)
Platelets: 172 10*3/uL (ref 150–400)
RBC: 4.94 MIL/uL (ref 3.87–5.11)
RDW: 11.9 % (ref 11.5–15.5)
WBC: 7.1 10*3/uL (ref 4.0–10.5)
nRBC: 0 % (ref 0.0–0.2)

## 2023-10-24 LAB — TROPONIN I (HIGH SENSITIVITY)
Troponin I (High Sensitivity): 7 ng/L (ref <18)
Troponin I (High Sensitivity): 7 ng/L (ref <18)

## 2023-10-24 MED ORDER — ACETAMINOPHEN 500 MG PO TABS
1000.0000 mg | ORAL_TABLET | Freq: Once | ORAL | Status: AC
  Administered 2023-10-24: 1000 mg via ORAL
  Filled 2023-10-24: qty 2

## 2023-10-24 MED ORDER — IOHEXOL 350 MG/ML SOLN
75.0000 mL | Freq: Once | INTRAVENOUS | Status: AC | PRN
  Administered 2023-10-24: 75 mL via INTRAVENOUS

## 2023-10-24 NOTE — ED Triage Notes (Signed)
 Patient sent from [facility] walk in clinic due to HTN and headache x 3 days described majority frontal headache. Also had left arm pain last night. Sent for cardiac workup-no cp

## 2023-10-24 NOTE — ED Provider Notes (Signed)
 [place] EMERGENCY DEPARTMENT AT [facility] Provider Note   CSN: [id number] Arrival date & time: 10/24/23  1211     History  Chief Complaint  Patient presents with   Headache    Joy Walsh is a 63 y.o. female.   Headache 63 year old female history of hypertension, meningioma, migraines presenting for headache, left arm pain.  Patient is about 3 days ago she had relatively quick onset frontal headache.  Has been persistent since.  She also noted her blood pressure has been elevated in the 150s which is high for her over the last few days.  No vision changes or weakness or numbness.  Last night she had an episode of pretty severe left arm pain just distal to her left shoulder.  No chest pain or shortness of breath.  It was worse with movement.  She woke up today with no pain.  Right now she feels like some slight pressure on the left side of her chest otherwise feels okay.  No shortness of breath or pleuritic pain.  She has not had any recent trauma.  She does have some generalized bodyaches but no fevers or chills.  She does have history of meningioma.     Home Medications Prior to Admission medications   Medication Sig Start Date End Date Taking? Authorizing Provider  bisoprolol (ZEBETA) 10 MG tablet Take 10 mg by mouth daily. 06/27/22   [provider]  hydrochlorothiazide (HYDRODIURIL) 25 MG tablet Take 25 mg by mouth daily. 06/27/22   [provider]  ibuprofen (ADVIL,MOTRIN) 200 MG tablet Take 200 mg by mouth as needed for pain. Reported on 11/05/2015    [provider]  meloxicam (MOBIC) 15 MG tablet Take 1 tablet (15 mg total) by mouth daily. 03/25/23   Standiford, Jenelle Mages, DPM  methylPREDNISolone (MEDROL DOSEPAK) 4 MG TBPK tablet Take as directed for 6 days 03/25/23   Standiford, Jenelle Mages, DPM  omeprazole (PRILOSEC) 10 MG capsule Take 10 mg by mouth daily.    [provider]  ondansetron (ZOFRAN-ODT) 4 MG disintegrating  tablet Take 1-2 tablets (4-8 mg total) by mouth every 8 (eight) hours as needed. Take one pill with rizatriptan 06/29/22   Anson Fret, MD  rizatriptan (MAXALT-MLT) 5 MG disintegrating tablet Take 1 tablet (5 mg total) by mouth as needed for migraine. May repeat in 2 hours if needed 06/29/22   Anson Fret, MD  Ubrogepant (UBRELVY) 100 MG TABS Take 100 mg by mouth every 2 (two) hours as needed. Maximum 200mg  a day. 06/29/22   Anson Fret, MD      Allergies    Amoxicillin, Amoxicillin-pot clavulanate, Clarithromycin, and Sulfa antibiotics    Review of Systems   Review of Systems  Neurological:  Positive for headaches.  Review of systems completed and notable as per HPI.  ROS otherwise negative.   Physical Exam Updated Vital Signs BP (!) 142/81   Pulse 72   Temp 98.3 F (36.8 C) (Oral)   Resp 16   LMP 10/15/2013   SpO2 97%  Physical Exam Vitals and nursing note reviewed.  Constitutional:      General: She is not in acute distress.    Appearance: She is well-developed.  HENT:     Head: Normocephalic and atraumatic.     Mouth/Throat:     Mouth: Mucous membranes are moist.     Pharynx: Oropharynx is clear.  Eyes:     Extraocular Movements: Extraocular movements intact.  Conjunctiva/sclera: Conjunctivae normal.     Pupils: Pupils are equal, round, and reactive to light.  Cardiovascular:     Rate and Rhythm: Normal rate and regular rhythm.     Pulses: Normal pulses.     Heart sounds: Normal heart sounds. No murmur heard. Pulmonary:     Effort: Pulmonary effort is normal. No respiratory distress.     Breath sounds: Normal breath sounds.  Abdominal:     Palpations: Abdomen is soft.     Tenderness: There is no abdominal tenderness.  Musculoskeletal:        General: No swelling.     Cervical back: Normal range of motion and neck supple. No rigidity or tenderness.     Right lower leg: No edema.     Left lower leg: No edema.  Skin:    General: Skin is warm and  dry.     Capillary Refill: Capillary refill takes less than 2 seconds.  Neurological:     General: No focal deficit present.     Mental Status: She is alert and oriented to person, place, and time. Mental status is at baseline.     Cranial Nerves: No cranial nerve deficit.     Sensory: No sensory deficit.     Motor: No weakness.  Psychiatric:        Mood and Affect: Mood normal.     ED Results / Procedures / Treatments   Labs (all labs ordered are listed, but only abnormal results are displayed) Labs Reviewed  BASIC METABOLIC PANEL - Abnormal; Notable for the following components:      Result Value   Glucose, Bld 161 (*)    All other components within normal limits  CBC - Abnormal; Notable for the following components:   Hemoglobin 15.8 (*)    All other components within normal limits  TROPONIN I (HIGH SENSITIVITY)  TROPONIN I (HIGH SENSITIVITY)    EKG EKG Interpretation Date/Time:  Sunday October 24 2023 12:29:24 EST Ventricular Rate:  89 PR Interval:  160 QRS Duration:  72 QT Interval:  330 QTC Calculation: 401 R Axis:   44  Text Interpretation: Normal sinus rhythm Nonspecific ST abnormality Abnormal ECG When compared with ECG of 13-Feb-2008 11:40, PREVIOUS ECG IS PRESENT No significant change since last tracing Confirmed by Fulton Reek [phone]) on 10/24/2023 3:54:43 PM  Radiology CT Angio Head Neck W WO CM Result Date: 10/24/2023 CLINICAL DATA:  Severe headache EXAM: CT ANGIOGRAPHY HEAD AND NECK WITH AND WITHOUT CONTRAST TECHNIQUE: Multidetector CT imaging of the head and neck was performed using the standard protocol during bolus administration of intravenous contrast. Multiplanar CT image reconstructions and MIPs were obtained to evaluate the vascular anatomy. Carotid stenosis measurements (when applicable) are obtained utilizing NASCET criteria, using the distal internal carotid diameter as the denominator. RADIATION DOSE REDUCTION: This exam was performed according  to the departmental dose-optimization program which includes automated exposure control, adjustment of the mA and/or kV according to patient size and/or use of iterative reconstruction technique. CONTRAST:  75mL OMNIPAQUE IOHEXOL 350 MG/ML SOLN COMPARISON:  None Available. FINDINGS: CT HEAD FINDINGS See same day CT head for intracranial findings CTA NECK FINDINGS Aortic arch: Standard branching. Imaged portion shows no evidence of aneurysm or dissection. No significant stenosis of the major arch vessel origins. Right carotid system: No evidence of dissection, stenosis (50% or greater), or occlusion. Mild narrowing of the origin of the right internal carotid artery secondary to soft atherosclerotic plaque. Left carotid system: No evidence of  dissection, stenosis (50% or greater), or occlusion. Vertebral arteries: Left dominant. The right vertebral artery predominantly terminates in the PICA. No evidence of dissection, stenosis (50% or greater), or occlusion. The basilar artery is diffusely small in caliber. There are fetal PCAs bilaterally. Skeleton: Mild spinal canal narrowing at C4-C5 secondary to an eccentric right calcified disc bulge. Other neck: Negative. Upper chest: Negative. Review of the MIP images confirms the above findings CTA HEAD FINDINGS Anterior circulation: No significant stenosis, proximal occlusion, aneurysm, or vascular malformation. Posterior circulation: No significant stenosis, proximal occlusion, aneurysm, or vascular malformation. Venous sinuses: As permitted by contrast timing, patent. Anatomic variants: Fetal PCAs bilaterally Review of the MIP images confirms the above findings IMPRESSION: 1. No intracranial large vessel occlusion or significant stenosis. 2. No hemodynamically significant stenosis in the neck. Electronically Signed   By: Lorenza Cambridge M.D.   On: 10/24/2023 18:23   CT Head Wo Contrast Result Date: 10/24/2023 CLINICAL DATA:  Headache, increasing frequency or severity  EXAM: CT HEAD WITHOUT CONTRAST TECHNIQUE: Contiguous axial images were obtained from the base of the skull through the vertex without intravenous contrast. RADIATION DOSE REDUCTION: This exam was performed according to the departmental dose-optimization program which includes automated exposure control, adjustment of the mA and/or kV according to patient size and/or use of iterative reconstruction technique. COMPARISON:  07/28/2022 FINDINGS: Brain: No evidence of acute infarction, hemorrhage, hydrocephalus, extra-axial collection or new mass. Stable small calcified left anterior para fall seen meningioma. Vascular: No hyperdense vessel or unexpected calcification. Skull: Normal. Negative for fracture or focal lesion. Sinuses/Orbits: Bilateral maxillary sinus mucosal thickening. No air-fluid levels. Other: None. IMPRESSION: 1. No acute intracranial abnormality. 2. Bilateral maxillary sinus mucosal thickening. No air-fluid levels. Electronically Signed   By: Duanne Guess D.O.   On: 10/24/2023 17:38   DG Chest 2 View Result Date: 10/24/2023 CLINICAL DATA:  Arm pain.  Abnormal EKG. EXAM: CHEST - 2 VIEW COMPARISON:  November 09, 2016. FINDINGS: The heart size and mediastinal contours are within normal limits. Both lungs are clear. The visualized skeletal structures are unremarkable. IMPRESSION: No active cardiopulmonary disease. Electronically Signed   By: Lupita Raider M.D.   On: 10/24/2023 13:39    Procedures Procedures    Medications Ordered in ED Medications  iohexol (OMNIPAQUE) 350 MG/ML injection 75 mL (75 mLs Intravenous Contrast Given 10/24/23 1721)  acetaminophen (TYLENOL) tablet 1,000 mg (1,000 mg Oral Given 10/24/23 1901)    ED Course/ Medical Decision Making/ A&P                                  Medical Decision Making Amount and/or Complexity of Data Reviewed Labs: ordered. Radiology: ordered.  Risk OTC drugs. Prescription drug management.   Medical Decision Making:   Joy Walsh is a 63 y.o. female who presented to the ED today with headache, left arm pain.  All signs reviewed.  On exam she is well-appearing.  She has had 3 days of headache which was relatively quick onset.  Will obtain CT and CTA to rule out aneurysm, intracranial bleeding, mass effect from meningioma.  She has normal neurologic exam no signs of stroke.  She had some left arm pain last night, today she does have some slight chest discomfort that is atypical and worse with movement.  Initial troponin is normal and EKG is nonischemic.  Will trend troponin.  Low suspicion for PE given no tachycardia, hypoxia, shortness  of breath or pleuritic pain or DVT symptoms.  Lower suspicion for dissection based on nature of her pain as well.  No fever or neck stiffness, low suspicion for CNS infection.   Patient placed on continuous vitals and telemetry monitoring while in ED which was reviewed periodically.  Reviewed and confirmed nursing documentation for past medical history, family history, social history.`  Reassessment and Plan:   Patient remains asymptomatic.  Troponin negative x 2, low suspicion for ACS.  CT head and and CTA head and neck are unremarkable.  No evidence of concerning causes for her headache.  She is feeling slightly better.  Gave her some Tylenol for headache.  Unclear what caused her arm pain.  Could be musculoskeletal.  I have low suspicion for PE, dissection.  Recommend close follow-up with PCP.  Strict return precautions given.    Patient's presentation is most consistent with acute complicated illness / injury requiring diagnostic workup.           Final Clinical Impression(s) / ED Diagnoses Final diagnoses:  Acute nonintractable headache, unspecified headache type  Left arm pain    Rx / DC Orders ED Discharge Orders     None         Laurence Spates, MD 10/24/23 2129

## 2023-10-24 NOTE — ED Notes (Signed)
 Pt reports having chest pain. Triage RN made aware.

## 2023-10-24 NOTE — Discharge Instructions (Signed)
 Your CT scan and lab work today were reassuring.  I recommend you follow close with your primary care doctor to reevaluate your blood pressure and your headache and arm pain.  If you develop severe chest pain, difficulty breathing, weakness or numbness or vision changes you need to return to the ED.

## 2023-10-24 NOTE — ED Provider Triage Note (Signed)
 Emergency Medicine Provider Triage Evaluation Note  Joy Walsh , a 63 y.o. female  was evaluated in triage.  Pt complains of left arm pain.  Review of Systems  Positive:  Negative:   Physical Exam  BP (!) 143/87 (BP Location: Left Arm)   Pulse 86   Temp 98.4 F (36.9 C) (Oral)   Resp 15   LMP 10/15/2013   SpO2 95%  Gen:   Awake, no distress   Resp:  Normal effort  MSK:   Moves extremities without difficulty  Other:    Medical Decision Making  Medically screening exam initiated at 12:35 PM.  Appropriate orders placed.  Joy Walsh was informed that the remainder of the evaluation will be completed by another provider, this initial triage assessment does not replace that evaluation, and the importance of remaining in the ED until their evaluation is complete.  Headache and HTN x3 days. 150/120 at home. UC sent patient to ED because she had an episode of left arm pain last night and has cardiac hx. Last dose of pain meds - 400mg  of Advil last night.    Joy Walsh, [place] 10/24/23 1236

## 2023-10-25 DIAGNOSIS — J0100 Acute maxillary sinusitis, unspecified: Secondary | ICD-10-CM | POA: Diagnosis not present

## 2023-10-25 DIAGNOSIS — I10 Essential (primary) hypertension: Secondary | ICD-10-CM | POA: Diagnosis not present

## 2023-11-10 ENCOUNTER — Other Ambulatory Visit: Payer: Self-pay | Admitting: Family Medicine

## 2023-11-10 DIAGNOSIS — I700 Atherosclerosis of aorta: Secondary | ICD-10-CM

## 2023-11-11 ENCOUNTER — Other Ambulatory Visit (HOSPITAL_BASED_OUTPATIENT_CLINIC_OR_DEPARTMENT_OTHER): Payer: Self-pay | Admitting: Family Medicine

## 2023-11-11 DIAGNOSIS — Z1231 Encounter for screening mammogram for malignant neoplasm of breast: Secondary | ICD-10-CM

## 2023-11-11 DIAGNOSIS — M130 Polyarthritis, unspecified: Secondary | ICD-10-CM

## 2023-11-11 DIAGNOSIS — I700 Atherosclerosis of aorta: Secondary | ICD-10-CM

## 2023-11-17 ENCOUNTER — Ambulatory Visit (HOSPITAL_BASED_OUTPATIENT_CLINIC_OR_DEPARTMENT_OTHER)
Admission: RE | Admit: 2023-11-17 | Discharge: 2023-11-17 | Disposition: A | Discharge status: Home / Self Care | Payer: Self-pay | Source: Ambulatory Visit | Attending: Family Medicine | Admitting: Family Medicine

## 2023-11-17 ENCOUNTER — Other Ambulatory Visit

## 2023-11-17 ENCOUNTER — Encounter (HOSPITAL_BASED_OUTPATIENT_CLINIC_OR_DEPARTMENT_OTHER): Payer: Self-pay | Admitting: Radiology

## 2023-11-17 DIAGNOSIS — Z1231 Encounter for screening mammogram for malignant neoplasm of breast: Secondary | ICD-10-CM | POA: Insufficient documentation

## 2023-11-17 DIAGNOSIS — M130 Polyarthritis, unspecified: Secondary | ICD-10-CM | POA: Insufficient documentation

## 2023-11-17 DIAGNOSIS — I700 Atherosclerosis of aorta: Secondary | ICD-10-CM | POA: Insufficient documentation

## 2023-12-08 DIAGNOSIS — M4722 Other spondylosis with radiculopathy, cervical region: Secondary | ICD-10-CM | POA: Diagnosis not present

## 2023-12-08 DIAGNOSIS — Z6831 Body mass index (BMI) 31.0-31.9, adult: Secondary | ICD-10-CM | POA: Diagnosis not present

## 2023-12-16 DIAGNOSIS — M47816 Spondylosis without myelopathy or radiculopathy, lumbar region: Secondary | ICD-10-CM | POA: Diagnosis not present

## 2023-12-16 DIAGNOSIS — M545 Low back pain, unspecified: Secondary | ICD-10-CM | POA: Diagnosis not present

## 2023-12-30 DIAGNOSIS — M4802 Spinal stenosis, cervical region: Secondary | ICD-10-CM | POA: Diagnosis not present

## 2023-12-30 DIAGNOSIS — M50222 Other cervical disc displacement at C5-C6 level: Secondary | ICD-10-CM | POA: Diagnosis not present

## 2023-12-30 DIAGNOSIS — M47812 Spondylosis without myelopathy or radiculopathy, cervical region: Secondary | ICD-10-CM | POA: Diagnosis not present

## 2023-12-30 DIAGNOSIS — M5021 Other cervical disc displacement,  high cervical region: Secondary | ICD-10-CM | POA: Diagnosis not present

## 2023-12-30 DIAGNOSIS — M4722 Other spondylosis with radiculopathy, cervical region: Secondary | ICD-10-CM | POA: Diagnosis not present

## 2024-01-03 DIAGNOSIS — M5136 Other intervertebral disc degeneration, lumbar region with discogenic back pain only: Secondary | ICD-10-CM | POA: Diagnosis not present

## 2024-01-03 DIAGNOSIS — M4802 Spinal stenosis, cervical region: Secondary | ICD-10-CM | POA: Diagnosis not present

## 2024-01-03 DIAGNOSIS — Z6830 Body mass index (BMI) 30.0-30.9, adult: Secondary | ICD-10-CM | POA: Diagnosis not present

## 2024-01-03 DIAGNOSIS — M4722 Other spondylosis with radiculopathy, cervical region: Secondary | ICD-10-CM | POA: Diagnosis not present
